# Patient Record
Sex: Female | Born: 1935 | Race: White | Hispanic: No | Marital: Married | Smoking: Never smoker
Health system: Southern US, Community
[De-identification: ages and names within clinical notes are randomized; demographics above are authoritative.]

## PROBLEM LIST (undated history)

## (undated) DIAGNOSIS — I2510 Atherosclerotic heart disease of native coronary artery without angina pectoris: Secondary | ICD-10-CM

## (undated) DIAGNOSIS — N189 Chronic kidney disease, unspecified: Secondary | ICD-10-CM

## (undated) DIAGNOSIS — M810 Age-related osteoporosis without current pathological fracture: Secondary | ICD-10-CM

## (undated) DIAGNOSIS — E119 Type 2 diabetes mellitus without complications: Secondary | ICD-10-CM

## (undated) DIAGNOSIS — R319 Hematuria, unspecified: Secondary | ICD-10-CM

## (undated) DIAGNOSIS — G252 Other specified forms of tremor: Secondary | ICD-10-CM

## (undated) DIAGNOSIS — I10 Essential (primary) hypertension: Secondary | ICD-10-CM

## (undated) DIAGNOSIS — E785 Hyperlipidemia, unspecified: Secondary | ICD-10-CM

## (undated) DIAGNOSIS — R4181 Age-related cognitive decline: Secondary | ICD-10-CM

## (undated) DIAGNOSIS — R809 Proteinuria, unspecified: Secondary | ICD-10-CM

## (undated) DIAGNOSIS — K5900 Constipation, unspecified: Secondary | ICD-10-CM

## (undated) DIAGNOSIS — F0390 Unspecified dementia without behavioral disturbance: Secondary | ICD-10-CM

## (undated) HISTORY — DX: Atherosclerotic heart disease of native coronary artery without angina pectoris: I25.10

## (undated) HISTORY — DX: Essential (primary) hypertension: I10

## (undated) HISTORY — DX: Hematuria, unspecified: R31.9

## (undated) HISTORY — PX: APPENDECTOMY: SHX54

## (undated) HISTORY — DX: Type 2 diabetes mellitus without complications: E11.9

## (undated) HISTORY — DX: Hyperlipidemia, unspecified: E78.5

## (undated) HISTORY — DX: Age-related osteoporosis without current pathological fracture: M81.0

## (undated) HISTORY — DX: Other specified forms of tremor: G25.2

## (undated) HISTORY — DX: Age-related cognitive decline: R41.81

## (undated) HISTORY — DX: Chronic kidney disease, unspecified: N18.9

## (undated) HISTORY — DX: Constipation, unspecified: K59.00

## (undated) HISTORY — DX: Proteinuria, unspecified: R80.9

---

## 2003-09-29 HISTORY — PX: OTHER SURGICAL HISTORY: SHX169

## 2007-11-23 ENCOUNTER — Ambulatory Visit: Payer: Self-pay | Admitting: Unknown Physician Specialty

## 2007-12-21 ENCOUNTER — Ambulatory Visit: Payer: Self-pay | Admitting: General Surgery

## 2008-02-08 ENCOUNTER — Other Ambulatory Visit: Payer: Self-pay

## 2008-02-08 ENCOUNTER — Emergency Department: Payer: Self-pay | Admitting: Emergency Medicine

## 2008-06-18 ENCOUNTER — Ambulatory Visit: Payer: Self-pay | Admitting: Neurology

## 2008-12-21 ENCOUNTER — Emergency Department: Payer: Self-pay | Admitting: Emergency Medicine

## 2009-02-21 ENCOUNTER — Emergency Department: Payer: Self-pay | Admitting: Internal Medicine

## 2009-07-01 ENCOUNTER — Emergency Department: Payer: Self-pay | Admitting: Emergency Medicine

## 2010-05-27 ENCOUNTER — Ambulatory Visit: Payer: Self-pay | Admitting: Family Medicine

## 2010-05-27 LAB — HM MAMMOGRAPHY

## 2011-05-04 ENCOUNTER — Ambulatory Visit: Payer: Self-pay | Admitting: Ophthalmology

## 2011-05-11 ENCOUNTER — Ambulatory Visit: Payer: Self-pay | Admitting: Ophthalmology

## 2011-05-12 ENCOUNTER — Emergency Department: Payer: Self-pay | Admitting: Emergency Medicine

## 2011-06-04 ENCOUNTER — Inpatient Hospital Stay: Payer: Self-pay | Admitting: Orthopedic Surgery

## 2011-06-11 ENCOUNTER — Emergency Department: Payer: Self-pay | Admitting: Emergency Medicine

## 2011-06-12 ENCOUNTER — Encounter: Payer: Self-pay | Admitting: Internal Medicine

## 2011-06-29 ENCOUNTER — Emergency Department: Payer: Self-pay | Admitting: Emergency Medicine

## 2011-06-29 ENCOUNTER — Encounter: Payer: Self-pay | Admitting: Internal Medicine

## 2011-06-30 ENCOUNTER — Ambulatory Visit: Payer: Self-pay | Admitting: Internal Medicine

## 2011-07-03 ENCOUNTER — Ambulatory Visit: Payer: Self-pay | Admitting: Internal Medicine

## 2011-07-06 ENCOUNTER — Telehealth: Payer: Self-pay | Admitting: Gastroenterology

## 2011-07-06 DIAGNOSIS — R1900 Intra-abdominal and pelvic swelling, mass and lump, unspecified site: Secondary | ICD-10-CM

## 2011-07-06 NOTE — Telephone Encounter (Signed)
Pt scheduled  

## 2011-07-06 NOTE — Telephone Encounter (Signed)
I spoke with the Pt husband and he advised me that she is at [facility] in [place] I will call and fax over the instructions and review meds.  [place] spoken to [place] took the instructions and meds reviewed and faxed to the home.[phone]

## 2011-07-09 ENCOUNTER — Ambulatory Visit: Payer: Self-pay

## 2011-07-09 ENCOUNTER — Encounter: Payer: Self-pay | Admitting: Gastroenterology

## 2011-07-16 LAB — PATHOLOGY REPORT

## 2011-07-21 ENCOUNTER — Telehealth: Payer: Self-pay | Admitting: Gastroenterology

## 2011-07-21 NOTE — Telephone Encounter (Signed)
I LVM today to discuss results.  Also I called referring MD, Dr. Haze Rushing.  FNA shows neuroendocrine lesion (final was read 10/18).  Dr. Haze Rushing was already aware of path, has spoken with patient and is scheduling further workup, testing.

## 2011-07-23 ENCOUNTER — Encounter: Payer: Self-pay | Admitting: Gastroenterology

## 2011-07-30 ENCOUNTER — Ambulatory Visit: Payer: Self-pay | Admitting: Internal Medicine

## 2011-07-30 ENCOUNTER — Encounter: Payer: Self-pay | Admitting: Internal Medicine

## 2011-08-29 ENCOUNTER — Encounter: Payer: Self-pay | Admitting: Internal Medicine

## 2011-08-29 ENCOUNTER — Ambulatory Visit: Payer: Self-pay | Admitting: Internal Medicine

## 2011-09-29 ENCOUNTER — Encounter: Payer: Self-pay | Admitting: Internal Medicine

## 2011-10-30 ENCOUNTER — Encounter: Payer: Self-pay | Admitting: Internal Medicine

## 2012-09-29 ENCOUNTER — Ambulatory Visit: Payer: Self-pay | Admitting: Ophthalmology

## 2012-10-03 ENCOUNTER — Ambulatory Visit: Payer: Self-pay | Admitting: Ophthalmology

## 2013-03-16 IMAGING — CR DG C-ARM 1-60 MIN
2 series · 2 of 2 positions shown · non-contrast
Comparison: none

REASON FOR EXAM: TFN LONG
COMMENTS:

PROCEDURE:     DXR - DXR C-ARM WITH 2 VIEWS LT HIP  - June 04, 2011  [DATE]
RESULT:     Comparison: AP pelvis 06/04/2011

[[id] (1 of 2)]
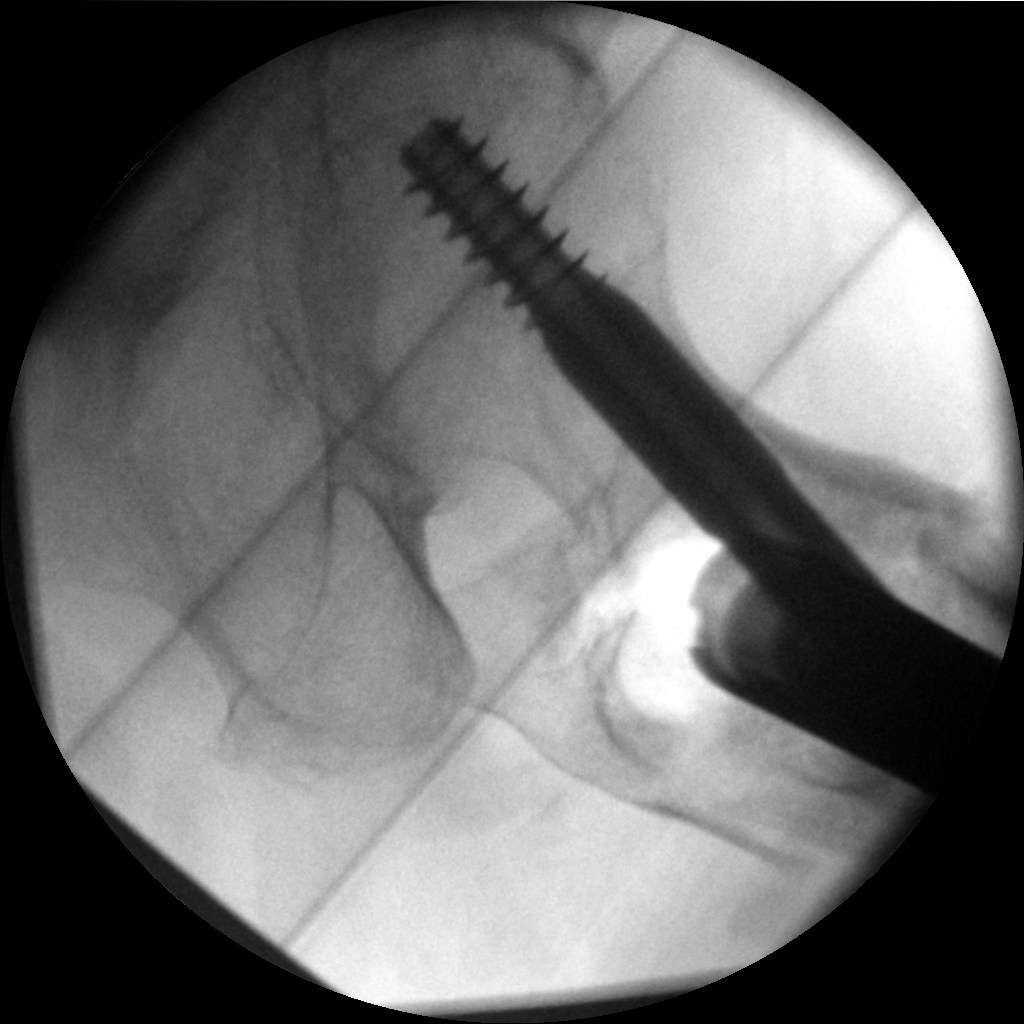

[[id] (2 of 2)]
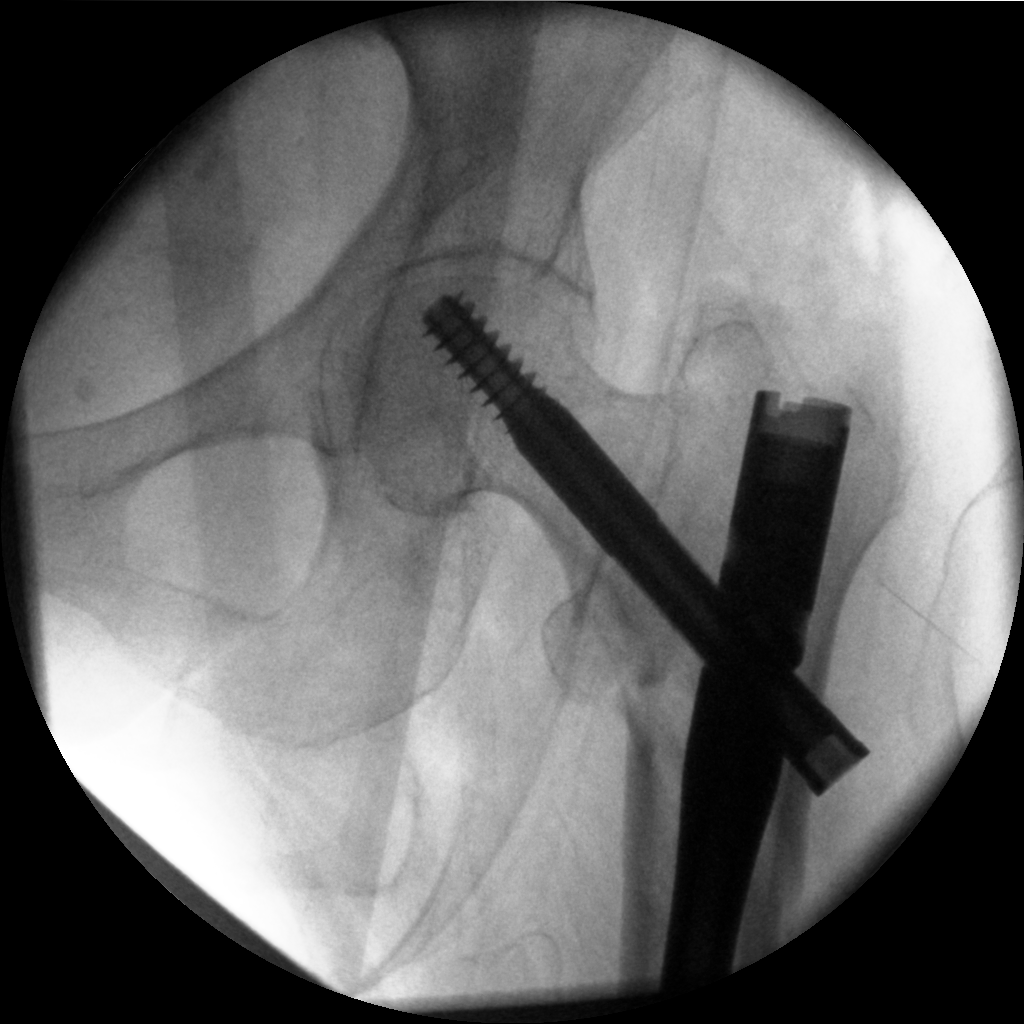

[2 of 2 positions shown; findings below may reference images not displayed]

FINDINGS: Two fluoroscopic images were submitted from intraoperative procedure.
Intramedullary rod and interlocking screw is seen in the proximal femur. The
mid and distal aspect of the intramedullary rod are not visualized. There is
improved alignment of the intertrochanteric fracture. Remainder of the
interpretation is deferred to the performing clinician.
IMPRESSION: Please see above.

## 2013-03-30 ENCOUNTER — Ambulatory Visit: Payer: Self-pay | Admitting: Cardiology

## 2014-07-28 ENCOUNTER — Emergency Department: Payer: Self-pay | Admitting: Emergency Medicine

## 2014-08-17 ENCOUNTER — Ambulatory Visit: Payer: Self-pay

## 2014-08-20 ENCOUNTER — Ambulatory Visit: Payer: Self-pay

## 2014-08-22 ENCOUNTER — Ambulatory Visit: Payer: Self-pay

## 2014-08-22 LAB — CBC CANCER CENTER
BASOS ABS: 0.2 x10 3/mm — AB (ref 0.0–0.1)
BASOS PCT: 1.4 %
EOS ABS: 0.1 x10 3/mm (ref 0.0–0.7)
EOS PCT: 0.9 %
HCT: 34.6 % — ABNORMAL LOW (ref 35.0–47.0)
HGB: 11.2 g/dL — ABNORMAL LOW (ref 12.0–16.0)
LYMPHS ABS: 2.9 x10 3/mm (ref 1.0–3.6)
Lymphocyte %: 27 %
MCH: 32 pg (ref 26.0–34.0)
MCHC: 32.3 g/dL (ref 32.0–36.0)
MCV: 99 fL (ref 80–100)
MONO ABS: 0.7 x10 3/mm (ref 0.2–0.9)
MONOS PCT: 6.6 %
NEUTROS PCT: 64.1 %
Neutrophil #: 6.8 x10 3/mm — ABNORMAL HIGH (ref 1.4–6.5)
Platelet: 267 x10 3/mm (ref 150–440)
RBC: 3.49 10*6/uL — AB (ref 3.80–5.20)
RDW: 18.6 % — AB (ref 11.5–14.5)
WBC: 10.7 x10 3/mm (ref 3.6–11.0)

## 2014-08-22 LAB — COMPREHENSIVE METABOLIC PANEL
ALT: 16 U/L
AST: 11 U/L — AB (ref 15–37)
Albumin: 4.1 g/dL (ref 3.4–5.0)
Alkaline Phosphatase: 92 U/L
Anion Gap: 6 — ABNORMAL LOW (ref 7–16)
BUN: 11 mg/dL (ref 7–18)
Bilirubin,Total: 0.7 mg/dL (ref 0.2–1.0)
CALCIUM: 9.6 mg/dL (ref 8.5–10.1)
CREATININE: 0.91 mg/dL (ref 0.60–1.30)
Chloride: 98 mmol/L (ref 98–107)
Co2: 31 mmol/L (ref 21–32)
EGFR (African American): >60
Glucose: 156 mg/dL — ABNORMAL HIGH (ref 65–99)
OSMOLALITY: 273 (ref 275–301)
POTASSIUM: 3.9 mmol/L (ref 3.5–5.1)
Sodium: 135 mmol/L — ABNORMAL LOW (ref 136–145)
TOTAL PROTEIN: 7.9 g/dL (ref 6.4–8.2)

## 2014-08-22 LAB — APTT: Activated PTT: 28.3 secs (ref 23.6–35.9)

## 2014-08-22 LAB — PROTIME-INR
INR: 1
Prothrombin Time: 13.5 secs (ref 11.5–14.7)

## 2014-08-28 ENCOUNTER — Ambulatory Visit: Payer: Self-pay

## 2014-09-10 ENCOUNTER — Ambulatory Visit: Payer: Self-pay | Admitting: Obstetrics & Gynecology

## 2014-09-10 LAB — BASIC METABOLIC PANEL
Anion Gap: 5 — ABNORMAL LOW (ref 7–16)
BUN: 9 mg/dL (ref 7–18)
CO2: 30 mmol/L (ref 21–32)
CREATININE: 0.82 mg/dL (ref 0.60–1.30)
Calcium, Total: 9.4 mg/dL (ref 8.5–10.1)
Chloride: 100 mmol/L (ref 98–107)
EGFR (African American): >60
EGFR (Non-African Amer.): >60
GLUCOSE: 146 mg/dL — AB (ref 65–99)
Osmolality: 271 (ref 275–301)
Potassium: 4.3 mmol/L (ref 3.5–5.1)
Sodium: 135 mmol/L — ABNORMAL LOW (ref 136–145)

## 2014-09-10 LAB — APTT: Activated PTT: 27.4 secs (ref 23.6–35.9)

## 2014-09-10 LAB — CBC
HCT: 33.5 % — ABNORMAL LOW (ref 35.0–47.0)
HGB: 10.8 g/dL — AB (ref 12.0–16.0)
MCH: 31.9 pg (ref 26.0–34.0)
MCHC: 32.1 g/dL (ref 32.0–36.0)
MCV: 99 fL (ref 80–100)
PLATELETS: 273 10*3/uL (ref 150–440)
RBC: 3.37 10*6/uL — AB (ref 3.80–5.20)
RDW: 19.3 % — ABNORMAL HIGH (ref 11.5–14.5)
WBC: 10 10*3/uL (ref 3.6–11.0)

## 2014-09-10 LAB — PROTIME-INR
INR: 1.1
Prothrombin Time: 13.7 secs (ref 11.5–14.7)

## 2014-09-12 ENCOUNTER — Ambulatory Visit: Payer: Self-pay | Admitting: Obstetrics & Gynecology

## 2014-09-12 HISTORY — PX: ABDOMINAL HYSTERECTOMY: SHX81

## 2014-09-12 LAB — CREATININE, SERUM
Creatinine: 0.83 mg/dL (ref 0.60–1.30)
EGFR (African American): >60
EGFR (Non-African Amer.): >60

## 2014-09-13 LAB — CBC WITH DIFFERENTIAL/PLATELET
Basophil #: 0.1 10*3/uL (ref 0.0–0.1)
Basophil %: 0.4 %
EOS PCT: 0 %
Eosinophil #: 0 10*3/uL (ref 0.0–0.7)
HCT: 32.1 % — ABNORMAL LOW (ref 35.0–47.0)
HGB: 10.3 g/dL — ABNORMAL LOW (ref 12.0–16.0)
Lymphocyte #: 1.2 10*3/uL (ref 1.0–3.6)
Lymphocyte %: 6.4 %
MCH: 31.9 pg (ref 26.0–34.0)
MCHC: 32.1 g/dL (ref 32.0–36.0)
MCV: 99 fL (ref 80–100)
MONOS PCT: 5 %
Monocyte #: 0.9 x10 3/mm (ref 0.2–0.9)
Neutrophil #: 15.9 10*3/uL — ABNORMAL HIGH (ref 1.4–6.5)
Neutrophil %: 88.2 %
PLATELETS: 245 10*3/uL (ref 150–440)
RBC: 3.23 10*6/uL — ABNORMAL LOW (ref 3.80–5.20)
RDW: 18.7 % — AB (ref 11.5–14.5)
WBC: 18.1 10*3/uL — ABNORMAL HIGH (ref 3.6–11.0)

## 2014-09-13 LAB — BASIC METABOLIC PANEL
ANION GAP: 9 (ref 7–16)
Anion Gap: 8 (ref 7–16)
BUN: 12 mg/dL (ref 7–18)
BUN: 13 mg/dL (ref 7–18)
CHLORIDE: 103 mmol/L (ref 98–107)
CREATININE: 0.92 mg/dL (ref 0.60–1.30)
Calcium, Total: 8.5 mg/dL (ref 8.5–10.1)
Calcium, Total: 8.5 mg/dL (ref 8.5–10.1)
Chloride: 101 mmol/L (ref 98–107)
Co2: 24 mmol/L (ref 21–32)
Co2: 24 mmol/L (ref 21–32)
Creatinine: 0.9 mg/dL (ref 0.60–1.30)
EGFR (African American): >60
EGFR (African American): >60
GLUCOSE: 212 mg/dL — AB (ref 65–99)
Glucose: 176 mg/dL — ABNORMAL HIGH (ref 65–99)
OSMOLALITY: 273 (ref 275–301)
OSMOLALITY: 276 (ref 275–301)
POTASSIUM: 4.1 mmol/L (ref 3.5–5.1)
Potassium: 3.9 mmol/L (ref 3.5–5.1)
SODIUM: 135 mmol/L — AB (ref 136–145)
Sodium: 134 mmol/L — ABNORMAL LOW (ref 136–145)

## 2014-09-13 LAB — MAGNESIUM
Magnesium: 1.4 mg/dL — ABNORMAL LOW
Magnesium: 2.4 mg/dL

## 2014-09-28 ENCOUNTER — Ambulatory Visit: Payer: Self-pay

## 2014-09-28 ENCOUNTER — Ambulatory Visit: Payer: Self-pay | Admitting: Obstetrics and Gynecology

## 2014-10-29 ENCOUNTER — Ambulatory Visit: Payer: Self-pay | Admitting: Obstetrics and Gynecology

## 2015-01-18 NOTE — Op Note (Signed)
PATIENT NAME:  Doris Evans, Doris Evans MR#:  [id number] DATE OF BIRTH:  January 24, 1929  DATE OF PROCEDURE:  10/03/2012  PREOPERATIVE DIAGNOSIS: Cataract, right eye.   POSTOPERATIVE DIAGNOSIS: Cataract, right eye.   PROCEDURE PERFORMED: Extracapsular cataract extraction using phacoemulsification with placement of Alcon [id number], 23.5 diopter posterior chamber lens, serial number [id number].   SURGEON: Maylon PeppersSteven A. Chamia Schmutz, M.D.   ANESTHESIA: 4% lidocaine and 0.75% Marcaine a 50-50 mixture with 10 units/mL of Hylenex added, given as a peribulbar.   ANESTHESIOLOGIST: Dr. Pernell DupreAdams.   COMPLICATIONS: None.   ESTIMATED BLOOD LOSS: Less than 1 mL.   DESCRIPTION OF PROCEDURE: The patient was brought to the operating room and given a peribulbar block. She was prepped and draped in the usual fashion. The vertical rectus muscles were imbricated using 5-0 silk sutures, bridle sutures. A limbal peritomy was carried out at 12 o'clock for one clock hour and hemostasis was obtained with cautery. A partial thickness scleral groove was made at the posterior surgical limbus and dissected anteriorly into clear cornea with an Alcon crescent knife. The anterior chamber was entered superonasally through clear cornea with a paracentesis knife. Air was used to replace the aqueous and vision blue was inserted into the anterior chamber to paint the anterior capsule. DisCoVisc was then used to displace the air and the anterior chamber was entered through the lamellar dissection with a 2.6 mm keratome.  A continuous tear circular capsulorrhexis was carried out without difficulty. Hydrodissection was used to loosen the nucleus and phacoemulsification was carried out in a divide and conquer technique. Total ultrasound time was 8.13 seconds with an average power of 29.9% and a CDE of 198.4. The lens was noted to be 4+ hard. Irrigation-aspiration was used to remove the residual cortex. The capsular bag was inflated with DisCoVisc and the intraocular  lens was inserted in the capsular bag under direct visualization. Irrigation-aspiration was used to remove the residual DisCoVisc. The wound was then inflated with balanced salt. Miostat was injected in the paracentesis track. A tenth of mL of cefuroxime was also injected through the paracentesis track. The wound was checked for leaks. None were found. The conjunctiva was closed with cautery. Bridle sutures were removed. Two drops of Vigamox were placed. A shield was placed on the eye. The patient was discharged to the recovery room in good condition.   ____________________________ Maylon PeppersSteven A. Lendell Gallick, MD sad:aw D: 10/03/2012 13:50:07 ET T: 10/04/2012 06:36:23 ET JOB#: [id number]  cc: Viviann SpareSteven A. Nivaan Dicenzo, MD, <Dictator> Erline LevineSTEVEN A Tate Jerkins MD ELECTRONICALLY SIGNED 10/10/2012 13:10

## 2015-01-19 NOTE — Op Note (Signed)
PATIENT NAME:  Doris RosserGE, Jazell F MR#:  [id number] DATE OF BIRTH:  12/02/1928  DATE OF PROCEDURE:  09/12/2014  PREOPERATIVE DIAGNOSIS:  Serous carcinoma of the uterus.   POSTOPERATIVE DIAGNOSIS:  Serous carcinoma of the uterus.  PROCEDURE: Total laparoscopic hysterectomy with bilateral salpingo-oophorectomy.   SURGEON: Dr. Dierdre Searles. Paul Mkenzie Dotts.   ASSISTANT: Leida LauthAndrew Berchuck, M.D.   ANESTHESIA: General.   ESTIMATED BLOOD LOSS: Minimal 10 ml.   COMPLICATIONS: None.   FINDINGS: A small sized uterus normal size ovaries, no evidence for extrauterine disease. Normal liver, gallbladder and bowel.   DISPOSITION: To the recovery room in stable condition.   TECHNIQUE: The patient is prepped and draped in the usual sterile fashion after adequate anesthesia is obtained in the dorsal lithotomy position. Bladder was drained with a Foley catheter that was left in place for the entirety of the case. Speculum is placed and the cervix is identified and then  grasped with a tenaculum. The uterus is sounded to 7 cm, a V-Care device is placed for manipulation of the uterus and excision of the cervix.   Attention is then turned to the abdomen where a Veress needle is inserted through a 5 mm infraumbilical incision after Marcaine is used to anesthetize the skin. Veress needle placement is confirmed using the hanging drop technique and the abdomen is then insufflated with CO2 gas. A 5 mm trocar is then inserted under direct visualization with the laparoscope with no injuries or bleeding noted. The patient is placed in Trendelenburg position. The above-mentioned findings are visualized.   An 11 mm trocar is placed in the right lower quadrant and a 5 mm trocar is placed in the left lower quadrant lateral to the inferior epigastric blood vessels with no injuries or bleeding noted. Washings are obtained with saline fluid injected on and around the uterus with aspiration of this fluid.   The right and left infundibulopelvic blood  vessels and ligaments are carefully dissected and observed to be away from the ureter. The pedicle was then coagulated with a bipolar cautery device and then a careful dissection using the Harmonic scalpel is performed. The round ligaments are carefully coagulated and cut with excellent hemostasis noted and then the broad ligament is dissected down to the level of the uterine arteries. The uterine arteries are carefully coagulated and cut. The V-Care device is seen tenting through the tissues at the cervicovaginal injunction. The bladder was dissected inferiorly, and away. A circumferential incision using the Harmonic scalpel is used to completely amputate the uterus cervix along with the adnexa on both sides and it is then removed vaginally. A balloon is left in the vagina to maintain pneumoperitoneum until the conclusion of suturing of the cuff.   Using the Endo Stitch device and a V-Lock suture the vaginal cuff is closed in a running fashion.  The vaginal exam reveals complete closure of the vaginal cuff without defect or leakage of air. Excellent hemostasis is also noted. Pelvic cavity is irrigated with aspiration of all fluid. Examination of all pedicles revealed excellent hemostasis. Observation of the ureters as well as bowel reveals no apparent injury or complication. The patient is leveled, gas is expelled. Trocars are removed, and skin is closed with Dermabond.  The vaginal balloon is removed and Foley catheter is left in place. The patient goes to the recovery room in stable condition, tolerating the procedure well.  All sponge, instrument, and needle counts are correct at the conclusion of the case.   ____________________________ R. Annamarie MajorPaul Linnea Todisco,  MD rph:at D: 09/12/2014 09:33:32 ET T: 09/12/2014 17:14:16 ET JOB#: [id number]  cc: Dierdre Searles, MD, <Dictator> Nadara Mustard MD ELECTRONICALLY SIGNED 09/13/2014 8:04

## 2015-01-19 NOTE — Consult Note (Signed)
PATIENT NAME:  Doris Evans, Doris Evans MR#:  [id number] DATE OF BIRTH:  Apr 18, 1929  DATE OF CONSULTATION:  09/12/2014  REFERRING PHYSICIAN:  Dierdre Searles, MD   CONSULTING PHYSICIAN:  Shaune Pollack, MD  PRIMARY CARE PHYSICIAN:  Steele Sizer, MD    REASON FOR CONSULT:  Slow breathing rate today.   HISTORY OF PRESENT ILLNESS:  The patient is an 81 years old Caucasian female with a history of hypertension, diabetes, was admitted for uterine cancer, status post laparoscopic hysterectomy. After surgery, the patient was noticed to have a slow breathing rate at about 10 per minute.  Dr. Tiburcio Pea has concerns, so he requested a medical consult from hospitalist. According to the nurse, the patient was under general anesthesia during operation. The patient was sleepy andsomnolent after transfer to the OB/GYN unit. The patient's breathing rate was about 7 to 10 but no trouble breathing.  O2 saturation was 100%.   When I see the patient the patient is awake, coughing some sputum. The patient denies any symptoms. She denies any shortness of breath or hematemesis. Denies any chest pain, palpitation, abdominal pain, nausea, vomiting, or diarrhea.   PAST MEDICAL HISTORY: Chickenpox, malignant hypertension, vertigo, diabetes 2, osteoporosis, thyroid nodule.   SURGICAL HISTORY: Appendectomy.   SOCIAL HISTORY: No smoking or drinking or illicit drugs.   FAMILY HISTORY: CAD, breast cancer, tremors. The patient's father died at the age of 2 of MI.  Mother died at the age of 20.   REVIEW OF SYSTEMS:  CONSTITUTIONAL: The patient denies any fever, chills. No headache or dizziness.  EYES: No double vision or blurred vision.  EARS, NOSE, THROAT: No postnasal drip, slurred speech or dysphagia.  CARDIOVASCULAR: No chest pain, palpitation, orthopnea, nocturnal dyspnea. No leg edema.  PULMONARY: Positive for cough, sputum but no shortness of breath or hematemesis. GASTROINTESTINAL: No abdominal pain, nausea, vomiting, diarrhea. No  melena or bloody stool.  GENITOURINARY: No dysuria, hematuria, or incontinence.  SKIN: No rash or jaundice.  NEUROLOGY: No syncope, loss of consciousness, or seizure.  ENDOCRINOLOGY: No polyuria or polydipsia, heat or cold intolerance.  HEMATOLOGY: No easy bruising or bleeding.   ALLERGIES: DIOVAN. KEFLEX.   HOME MEDICATIONS:  1.  Trazodone 50 mg p.o. at bedtime p.r.n. for sleep.  2.  Levemir 21 units subcutaneous once a day at bedtime.  3.  Colace 100 mg p.o. once a day p.r.n. for constipation.  4.  Atenolol 25 mg p.o. b.i.d.  5.  Aspirin 81 mg p.o. daily.   PHYSICAL EXAMINATION:  VITAL SIGNS:  Temperature 97.3, pulse 83, respiration rate 7, blood pressure 162/69, oxygen saturation 100% on oxygen 2 liters.  GENERAL: The patient is alert, awake, oriented x 3 but a little sleepy, in no acute distress.  HEENT: Pupils round, equal, react to light and accommodation. Moist oral mucosa.  NECK: Supple. No JVD or carotid bruit. No lymphadenopathy. No thyromegaly.  CARDIOVASCULAR: S1 and S2. Regular rate, rhythm. No murmurs or gallops.  PULMONARY: Bilateral air entry. No wheezing or rales. No use of accessory muscle to breathe.  ABDOMEN: Soft and no distention or tenderness. No organomegaly. Bowel sounds present.  EXTREMITIES: No edema, clubbing or cyanosis. No calf tenderness. Bilateral pedal pulses present.  SKIN: No rash or jaundice.  NEUROLOGIC: A and O x 3 but maybe a little bit demented, followed commands, no focal deficit. Power 4/5. Sensation intact.   LABORATORY DATA: WBC 10, hemoglobin 10.8, platelets 273, glucose 146, BUN 9, creatinine 0.82. Electrolytes are normal. This lab is 2  days ago.   Just now got a chest x-ray due to patient's low breathing rate.  Chest x-ray shows patchy opacity in the right lower lung suspicious of pneumonia, increased interstitial markings with some mild interstitial edema, possible left pleural effusion.   IMPRESSION:  1.  Slow breathing rate, which is  a possibly from anesthesia.  2.  Accelerated hypertension. The patient's blood pressure just now was 195/93.  3.  Diabetes.  4.  Anemia.  5. Possible pneumonia due to aspiration.  RECOMMENDATION:   1.  For slowing breathing rate, which is possibly due to anesthesia, I advised the patient and nurse monitor breathing rate and O2 saturation.  2.  For possible pneumonia from chest x-ray, I will start levaquin due to the risk of aspiration from anesthesia.  In addition, we will give DuoNeb p.r.n. and cough medication.  3.  For accelerated hypertension, I will continue atenolol and give hydralazine IV p.r.n.  4.  For diabetes, continue sliding scale.   I discussed the patient's condition and recommendations with the patient, the patient's husband and the son and the nurse. The patient seems not understanding about the code status. According to the patient's husband and son, the patient is a full code.   TIME SPENT: About 57 minutes.   ____________________________ Shaune PollackQing Hagen Tidd, MD qc:AT D: 09/12/2014 18:30:46 ET T: 09/12/2014 23:00:41 ET JOB#: [id number]  cc: Shaune PollackQing Brittanee Ghazarian, MD, <Dictator> Shaune PollackQING Corra Kaine MD ELECTRONICALLY SIGNED 09/13/2014 16:34

## 2015-01-21 LAB — SURGICAL PATHOLOGY

## 2015-08-21 ENCOUNTER — Ambulatory Visit (INDEPENDENT_AMBULATORY_CARE_PROVIDER_SITE_OTHER): Payer: Commercial Managed Care - HMO | Admitting: Unknown Physician Specialty

## 2015-08-21 ENCOUNTER — Encounter: Payer: Self-pay | Admitting: Unknown Physician Specialty

## 2015-08-21 VITALS — BP 150/71 | HR 91 | Temp 97.6°F | Ht <= 58 in | Wt 105.2 lb

## 2015-08-21 DIAGNOSIS — E1122 Type 2 diabetes mellitus with diabetic chronic kidney disease: Secondary | ICD-10-CM | POA: Diagnosis not present

## 2015-08-21 DIAGNOSIS — Z23 Encounter for immunization: Secondary | ICD-10-CM

## 2015-08-21 DIAGNOSIS — N182 Chronic kidney disease, stage 2 (mild): Secondary | ICD-10-CM | POA: Diagnosis not present

## 2015-08-21 DIAGNOSIS — M810 Age-related osteoporosis without current pathological fracture: Secondary | ICD-10-CM

## 2015-08-21 DIAGNOSIS — I1310 Hypertensive heart and chronic kidney disease without heart failure, with stage 1 through stage 4 chronic kidney disease, or unspecified chronic kidney disease: Secondary | ICD-10-CM

## 2015-08-21 DIAGNOSIS — I129 Hypertensive chronic kidney disease with stage 1 through stage 4 chronic kidney disease, or unspecified chronic kidney disease: Secondary | ICD-10-CM

## 2015-08-21 DIAGNOSIS — H6123 Impacted cerumen, bilateral: Secondary | ICD-10-CM

## 2015-08-21 DIAGNOSIS — E785 Hyperlipidemia, unspecified: Secondary | ICD-10-CM | POA: Insufficient documentation

## 2015-08-21 DIAGNOSIS — I25119 Atherosclerotic heart disease of native coronary artery with unspecified angina pectoris: Secondary | ICD-10-CM

## 2015-08-21 DIAGNOSIS — N189 Chronic kidney disease, unspecified: Secondary | ICD-10-CM | POA: Diagnosis not present

## 2015-08-21 DIAGNOSIS — E119 Type 2 diabetes mellitus without complications: Secondary | ICD-10-CM | POA: Insufficient documentation

## 2015-08-21 DIAGNOSIS — G252 Other specified forms of tremor: Secondary | ICD-10-CM | POA: Insufficient documentation

## 2015-08-21 DIAGNOSIS — K5900 Constipation, unspecified: Secondary | ICD-10-CM | POA: Insufficient documentation

## 2015-08-21 DIAGNOSIS — R4181 Age-related cognitive decline: Secondary | ICD-10-CM | POA: Insufficient documentation

## 2015-08-21 DIAGNOSIS — R319 Hematuria, unspecified: Secondary | ICD-10-CM | POA: Insufficient documentation

## 2015-08-21 DIAGNOSIS — I2510 Atherosclerotic heart disease of native coronary artery without angina pectoris: Secondary | ICD-10-CM | POA: Insufficient documentation

## 2015-08-21 DIAGNOSIS — R809 Proteinuria, unspecified: Secondary | ICD-10-CM | POA: Insufficient documentation

## 2015-08-21 LAB — LIPID PANEL PICCOLO, WAIVED
Chol/HDL Ratio Piccolo,Waive: 2.1 mg/dL
Cholesterol Piccolo, Waived: 128 mg/dL (ref <200)
HDL Chol Piccolo, Waived: 61 mg/dL (ref >59)
LDL Chol Calc Piccolo Waived: 54 mg/dL (ref <100)
Triglycerides Piccolo,Waived: 67 mg/dL (ref <150)
VLDL CHOL CALC PICCOLO,WAIVE: 13 mg/dL (ref <30)

## 2015-08-21 LAB — MICROALBUMIN, URINE WAIVED
Creatinine, Urine Waived: 100 mg/dL (ref 10–300)
MICROALB, UR WAIVED: 80 mg/L — AB (ref 0–19)

## 2015-08-21 LAB — BAYER DCA HB A1C WAIVED: HB A1C (BAYER DCA - WAIVED): 6.7 % (ref <7.0)

## 2015-08-21 MED ORDER — LOSARTAN POTASSIUM 50 MG PO TABS: 50.0000 mg | ORAL_TABLET | Freq: Every day | ORAL | Status: AC

## 2015-08-21 NOTE — Assessment & Plan Note (Signed)
LDL is 54.  Continue off medication

## 2015-08-21 NOTE — Progress Notes (Signed)
BP 150/71 mmHg  Pulse 91  Temp(Src) 97.6 F (36.4 C)  Ht 4' 9.1" (1.45 m)  Wt 105 lb 3.2 oz (47.718 kg)  BMI 22.70 kg/m2  SpO2 96%  LMP  (LMP Unknown)   Subjective:    Patient ID: Doris Evans, female    DOB: April 03, 1929, 81 y.o.   MRN: [id number]  HPI: Doris Evans is a 81 y.o. female  Chief Complaint  Patient presents with  . Diabetes  . Hyperlipidemia  . Hypertension   Diabetes:  Using medications without difficulties No hypoglycemic episodes: no No hyperglycemic episodes:no Feet problems:none Takes 21 units of Levimir at night Blood Sugars averaging: not checking eye exam within last year  Hypertension:  Not taking any BP meds Average home BP not checking  No chest pain with exertion or shortness of breath No Edema  Elevated Cholesterol: Not taking statins Diet compliance: losing weight Exercise: none  Relevant past medical, surgical, family and social history reviewed and updated as indicated. Interim medical history since our last visit reviewed. Allergies and medications reviewed and updated.  Review of Systems  HENT:       Trouble hearing.  Wants ears checked.     Per HPI unless specifically indicated above     Objective:    BP 150/71 mmHg  Pulse 91  Temp(Src) 97.6 F (36.4 C)  Ht 4' 9.1" (1.45 m)  Wt 105 lb 3.2 oz (47.718 kg)  BMI 22.70 kg/m2  SpO2 96%  LMP  (LMP Unknown)  Wt Readings from Last 3 Encounters:  08/21/15 105 lb 3.2 oz (47.718 kg)  09/04/14 110 lb (49.896 kg)    Physical Exam  Constitutional: She is oriented to person, place, and time. She appears well-developed and well-nourished. No distress.  HENT:  Head: Normocephalic and atraumatic.  Eyes: Conjunctivae and lids are normal. Right eye exhibits no discharge. Left eye exhibits no discharge. No scleral icterus.  Cardiovascular: Normal rate, regular rhythm and normal heart sounds.   Pulmonary/Chest: Effort normal and breath sounds normal. No respiratory distress.   Abdominal: Normal appearance. There is no splenomegaly or hepatomegaly.  Musculoskeletal: Normal range of motion.  Neurological: She is alert and oriented to person, place, and time.  Skin: Skin is intact. No rash noted. No pallor.  Psychiatric: She has a normal mood and affect. Her behavior is normal. Judgment and thought content normal.    Results for orders placed or performed in visit on 08/21/15  HM MAMMOGRAPHY  Result Value Ref Range   HM Mammogram from PP       Assessment & Plan:   Problem List Items Addressed This Visit      Unprioritized   Hyperlipemia    LDL is 54.  Continue off medication      Relevant Medications   losartan (COZAAR) 50 MG tablet   Other Relevant Orders   Lipid Panel Piccolo, Waived   Benign hypertension with chronic kidney disease    Restart Losartan do to Microalbumin of 89 and BP poorly controlled      Relevant Medications   losartan (COZAAR) 50 MG tablet   Diabetes (HCC)    Hgb A1C is 6.7.  Continue just 20 units at night.        Relevant Medications   losartan (COZAAR) 50 MG tablet   Other Relevant Orders   Bayer DCA Hb A1c Waived    Other Visit Diagnoses    Immunization due    -  Primary  Relevant Orders    Flu Vaccine QUAD 36+ mos IM (Completed)    Cerumen impaction, bilateral        Pt ed on using baby oil in ears and will recheck for removal        Follow up plan: Return for ceruman removal.

## 2015-08-21 NOTE — Assessment & Plan Note (Signed)
Hgb A1C is 6.7.  Continue just 20 units at night.

## 2015-08-21 NOTE — Assessment & Plan Note (Signed)
Restart Losartan do to Microalbumin of 89 and BP poorly controlled

## 2015-08-21 NOTE — Patient Instructions (Addendum)
May use mineral oil or baby oil in ear.  Put in a few drops daily.  This will make cleaning out the wax build-up easier at a future visit.    Continue insulin Add Losartan Take an 81 g ASA/day

## 2015-08-22 LAB — COMPREHENSIVE METABOLIC PANEL
A/G RATIO: 1.5 (ref 1.1–2.5)
ALT: 6 IU/L (ref 0–32)
AST: 13 IU/L (ref 0–40)
Albumin: 4.2 g/dL (ref 3.5–4.7)
Alkaline Phosphatase: 70 IU/L (ref 39–117)
BILIRUBIN TOTAL: 0.8 mg/dL (ref 0.0–1.2)
BUN/Creatinine Ratio: 14 (ref 11–26)
BUN: 10 mg/dL (ref 8–27)
CALCIUM: 9.5 mg/dL (ref 8.7–10.3)
CO2: 23 mmol/L (ref 18–29)
Chloride: 97 mmol/L (ref 97–106)
Creatinine, Ser: 0.73 mg/dL (ref 0.57–1.00)
GFR, EST AFRICAN AMERICAN: 87 mL/min/{1.73_m2} (ref >59)
GFR, EST NON AFRICAN AMERICAN: 76 mL/min/{1.73_m2} (ref >59)
GLOBULIN, TOTAL: 2.8 g/dL (ref 1.5–4.5)
Glucose: 127 mg/dL — ABNORMAL HIGH (ref 65–99)
POTASSIUM: 4.5 mmol/L (ref 3.5–5.2)
SODIUM: 137 mmol/L (ref 136–144)
TOTAL PROTEIN: 7 g/dL (ref 6.0–8.5)

## 2015-08-22 LAB — URIC ACID: Uric Acid: 5.9 mg/dL (ref 2.5–7.1)

## 2015-09-11 ENCOUNTER — Ambulatory Visit: Payer: Self-pay | Admitting: Unknown Physician Specialty

## 2015-10-11 ENCOUNTER — Other Ambulatory Visit: Payer: Self-pay | Admitting: Family Medicine

## 2015-10-11 NOTE — Telephone Encounter (Signed)
Looks like she is yours   

## 2015-10-22 ENCOUNTER — Inpatient Hospital Stay
Admission: EM | Admit: 2015-10-22 | Discharge: 2015-10-27 | DRG: 637 | Disposition: A | Discharge status: Home / Self Care | Payer: Medicare HMO | Attending: Internal Medicine | Admitting: Internal Medicine

## 2015-10-22 ENCOUNTER — Emergency Department: Payer: Medicare HMO

## 2015-10-22 ENCOUNTER — Encounter: Payer: Self-pay | Admitting: Emergency Medicine

## 2015-10-22 DIAGNOSIS — E1122 Type 2 diabetes mellitus with diabetic chronic kidney disease: Secondary | ICD-10-CM | POA: Diagnosis not present

## 2015-10-22 DIAGNOSIS — Z23 Encounter for immunization: Secondary | ICD-10-CM | POA: Diagnosis not present

## 2015-10-22 DIAGNOSIS — E1121 Type 2 diabetes mellitus with diabetic nephropathy: Secondary | ICD-10-CM | POA: Diagnosis present

## 2015-10-22 DIAGNOSIS — I2510 Atherosclerotic heart disease of native coronary artery without angina pectoris: Secondary | ICD-10-CM | POA: Diagnosis present

## 2015-10-22 DIAGNOSIS — N184 Chronic kidney disease, stage 4 (severe): Secondary | ICD-10-CM | POA: Diagnosis present

## 2015-10-22 DIAGNOSIS — Z8249 Family history of ischemic heart disease and other diseases of the circulatory system: Secondary | ICD-10-CM | POA: Diagnosis not present

## 2015-10-22 DIAGNOSIS — E871 Hypo-osmolality and hyponatremia: Secondary | ICD-10-CM | POA: Diagnosis present

## 2015-10-22 DIAGNOSIS — H9190 Unspecified hearing loss, unspecified ear: Secondary | ICD-10-CM | POA: Diagnosis present

## 2015-10-22 DIAGNOSIS — M810 Age-related osteoporosis without current pathological fracture: Secondary | ICD-10-CM | POA: Diagnosis not present

## 2015-10-22 DIAGNOSIS — N183 Chronic kidney disease, stage 3 (moderate): Secondary | ICD-10-CM | POA: Diagnosis not present

## 2015-10-22 DIAGNOSIS — I5023 Acute on chronic systolic (congestive) heart failure: Secondary | ICD-10-CM | POA: Diagnosis not present

## 2015-10-22 DIAGNOSIS — E860 Dehydration: Secondary | ICD-10-CM | POA: Diagnosis not present

## 2015-10-22 DIAGNOSIS — E162 Hypoglycemia, unspecified: Secondary | ICD-10-CM

## 2015-10-22 DIAGNOSIS — Z7982 Long term (current) use of aspirin: Secondary | ICD-10-CM

## 2015-10-22 DIAGNOSIS — Z794 Long term (current) use of insulin: Secondary | ICD-10-CM | POA: Diagnosis not present

## 2015-10-22 DIAGNOSIS — I130 Hypertensive heart and chronic kidney disease with heart failure and stage 1 through stage 4 chronic kidney disease, or unspecified chronic kidney disease: Secondary | ICD-10-CM | POA: Diagnosis not present

## 2015-10-22 DIAGNOSIS — F0390 Unspecified dementia without behavioral disturbance: Secondary | ICD-10-CM | POA: Diagnosis present

## 2015-10-22 DIAGNOSIS — E11649 Type 2 diabetes mellitus with hypoglycemia without coma: Secondary | ICD-10-CM | POA: Diagnosis present

## 2015-10-22 DIAGNOSIS — G9340 Encephalopathy, unspecified: Secondary | ICD-10-CM | POA: Diagnosis present

## 2015-10-22 DIAGNOSIS — N390 Urinary tract infection, site not specified: Secondary | ICD-10-CM

## 2015-10-22 DIAGNOSIS — R7989 Other specified abnormal findings of blood chemistry: Secondary | ICD-10-CM

## 2015-10-22 DIAGNOSIS — E785 Hyperlipidemia, unspecified: Secondary | ICD-10-CM | POA: Diagnosis present

## 2015-10-22 DIAGNOSIS — R062 Wheezing: Secondary | ICD-10-CM

## 2015-10-22 DIAGNOSIS — R778 Other specified abnormalities of plasma proteins: Secondary | ICD-10-CM

## 2015-10-22 LAB — CBC WITH DIFFERENTIAL/PLATELET
BASOS ABS: 0.1 10*3/uL (ref 0–0.1)
Basophils Relative: 1 %
EOS PCT: 0 %
Eosinophils Absolute: 0 10*3/uL (ref 0–0.7)
HCT: 36 % (ref 35.0–47.0)
Hemoglobin: 11.6 g/dL — ABNORMAL LOW (ref 12.0–16.0)
LYMPHS PCT: 10 %
Lymphs Abs: 0.9 10*3/uL — ABNORMAL LOW (ref 1.0–3.6)
MCH: 30.5 pg (ref 26.0–34.0)
MCHC: 32.3 g/dL (ref 32.0–36.0)
MCV: 94.6 fL (ref 80.0–100.0)
MONO ABS: 0.7 10*3/uL (ref 0.2–0.9)
MONOS PCT: 7 %
Neutro Abs: 8 10*3/uL — ABNORMAL HIGH (ref 1.4–6.5)
Neutrophils Relative %: 82 %
PLATELETS: 215 10*3/uL (ref 150–440)
RBC: 3.8 MIL/uL (ref 3.80–5.20)
RDW: 24.4 % — AB (ref 11.5–14.5)
WBC: 9.7 10*3/uL (ref 3.6–11.0)

## 2015-10-22 LAB — COMPREHENSIVE METABOLIC PANEL
ALT: 44 U/L (ref 14–54)
ANION GAP: 7 (ref 5–15)
AST: 37 U/L (ref 15–41)
Albumin: 3.8 g/dL (ref 3.5–5.0)
Alkaline Phosphatase: 57 U/L (ref 38–126)
BILIRUBIN TOTAL: 1.1 mg/dL (ref 0.3–1.2)
BUN: 13 mg/dL (ref 6–20)
CHLORIDE: 96 mmol/L — AB (ref 101–111)
CO2: 24 mmol/L (ref 22–32)
Calcium: 8.8 mg/dL — ABNORMAL LOW (ref 8.9–10.3)
Creatinine, Ser: 0.7 mg/dL (ref 0.44–1.00)
Glucose, Bld: 130 mg/dL — ABNORMAL HIGH (ref 65–99)
POTASSIUM: 4.1 mmol/L (ref 3.5–5.1)
Sodium: 127 mmol/L — ABNORMAL LOW (ref 135–145)
TOTAL PROTEIN: 6.6 g/dL (ref 6.5–8.1)

## 2015-10-22 LAB — URINALYSIS COMPLETE WITH MICROSCOPIC (ARMC ONLY)
BILIRUBIN URINE: NEGATIVE
Bacteria, UA: NONE SEEN
GLUCOSE, UA: 150 mg/dL — AB
HGB URINE DIPSTICK: NEGATIVE
KETONES UR: NEGATIVE mg/dL
NITRITE: NEGATIVE
PH: 5 (ref 5.0–8.0)
Protein, ur: 30 mg/dL — AB
Specific Gravity, Urine: 1.016 (ref 1.005–1.030)

## 2015-10-22 LAB — GLUCOSE, CAPILLARY
Glucose-Capillary: 90 mg/dL (ref 65–99)
Glucose-Capillary: 94 mg/dL (ref 65–99)
Glucose-Capillary: 275 mg/dL — ABNORMAL HIGH (ref 65–99)
Glucose-Capillary: 287 mg/dL — ABNORMAL HIGH (ref 65–99)

## 2015-10-22 LAB — TROPONIN I: TROPONIN I: 0.06 ng/mL — AB (ref <0.031)

## 2015-10-22 LAB — TSH: TSH: 3.017 u[IU]/mL (ref 0.350–4.500)

## 2015-10-22 MED ORDER — ONDANSETRON HCL 4 MG PO TABS: 4.0000 mg | ORAL_TABLET | Freq: Four times a day (QID) | ORAL | Status: DC | PRN

## 2015-10-22 MED ORDER — DEXTROSE 10 % IV SOLN
INTRAVENOUS | Status: DC
  Administered 2015-10-22: 1000 mL via INTRAVENOUS
  Administered 2015-10-23: 04:00:00 via INTRAVENOUS

## 2015-10-22 MED ORDER — DEXTROSE 5 % IV SOLN
1.0000 g | Freq: Once | INTRAVENOUS | Status: DC
  Filled 2015-10-22: qty 10

## 2015-10-22 MED ORDER — ONDANSETRON HCL 4 MG/2ML IJ SOLN
4.0000 mg | Freq: Four times a day (QID) | INTRAMUSCULAR | Status: DC | PRN
  Administered 2015-10-25: 4 mg via INTRAVENOUS
  Filled 2015-10-22: qty 2

## 2015-10-22 MED ORDER — ACETAMINOPHEN 325 MG PO TABS: 650.0000 mg | ORAL_TABLET | Freq: Four times a day (QID) | ORAL | Status: DC | PRN

## 2015-10-22 MED ORDER — DEXTROSE 5 % IV SOLN
1.0000 g | Freq: Once | INTRAVENOUS | Status: AC
  Administered 2015-10-22: 1 g via INTRAVENOUS
  Filled 2015-10-22: qty 10

## 2015-10-22 MED ORDER — SODIUM CHLORIDE 0.9 % IV SOLN
Freq: Once | INTRAVENOUS | Status: AC
  Administered 2015-10-22: 1000 mL via INTRAVENOUS

## 2015-10-22 MED ORDER — ACETAMINOPHEN 650 MG RE SUPP: 650.0000 mg | Freq: Four times a day (QID) | RECTAL | Status: DC | PRN

## 2015-10-22 MED ORDER — ENOXAPARIN SODIUM 40 MG/0.4ML ~~LOC~~ SOLN
40.0000 mg | SUBCUTANEOUS | Status: DC
  Administered 2015-10-22 – 2015-10-26 (×5): 40 mg via SUBCUTANEOUS
  Filled 2015-10-22 (×5): qty 0.4

## 2015-10-22 MED ORDER — DEXTROSE-NACL 5-0.45 % IV SOLN: INTRAVENOUS | Status: DC

## 2015-10-22 MED ORDER — ASPIRIN EC 81 MG PO TBEC
81.0000 mg | DELAYED_RELEASE_TABLET | Freq: Every day | ORAL | Status: DC
  Administered 2015-10-22 – 2015-10-27 (×6): 81 mg via ORAL
  Filled 2015-10-22 (×6): qty 1

## 2015-10-22 NOTE — ED Notes (Signed)
Pt too lethargic to eat lunch tray; admitting MD aware.

## 2015-10-22 NOTE — ED Provider Notes (Signed)
[facility] Emergency Department Provider Note     Time seen: ----------------------------------------- 11:34 AM on 10/22/2015 -----------------------------------------  Level V caveat: Review of systems and history is limited by confusion  I have reviewed the triage vital signs and the nursing notes.   HISTORY  Chief Complaint Altered Mental Status    HPI Doris Evans is a 82 y.o. female who presents ER with complaints of altered mental status for the last several days. Patient was found to have a blood sugar of about 40 on EMS arrival. Husband reports to EMS the patient is insulin-dependent diabetic but he does not check her sugar prior to giving insulin. She did receive insulin but did not eat breakfast, reports blood sugar was 172 after receiving 25 g of D50. Patient is alert to self but otherwise disoriented.   Past Medical History  Diagnosis Date  . Hypertension   . Other specified forms of tremor   . Osteoporosis   . Diabetes mellitus without complication (HCC)   . Hyperlipidemia   . Constipation   . Hematuria   . Proteinuria   . Age-related cognitive decline   . CAD (coronary artery disease)   . Chronic kidney disease     Patient Active Problem List   Diagnosis Date Noted  . Other specified forms of tremor 08/21/2015  . Senile osteoporosis 08/21/2015  . Hyperlipemia 08/21/2015  . Constipated 08/21/2015  . Hematuria 08/21/2015  . Proteinuria 08/21/2015  . Age-related cognitive decline 08/21/2015  . CAD (coronary artery disease) 08/21/2015  . Benign hypertension with chronic kidney disease 08/21/2015  . Diabetes (HCC) 08/21/2015  . CKD (chronic kidney disease), stage II 08/21/2015    Past Surgical History  Procedure Laterality Date  . Appendectomy    . Thyroid nodule removed  2005  . Abdominal hysterectomy  09/12/14    Allergies Review of patient's allergies indicates no known allergies.  Social History Social History   Substance Use Topics  . Smoking status: Never Smoker   . Smokeless tobacco: Never Used  . Alcohol Use: No    Review of Systems Unknown at this time ____________________________________________   PHYSICAL EXAM:  VITAL SIGNS: ED Triage Vitals  Enc Vitals Group     BP 10/22/15 1124 143/94 mmHg     Pulse Rate 10/22/15 1124 90     Resp 10/22/15 1124 22     Temp 10/22/15 1124 97.4 F (36.3 C)     Temp Source 10/22/15 1124 Oral     SpO2 10/22/15 1124 95 %     Weight 10/22/15 1124 110 lb (49.896 kg)     Height 10/22/15 1124 5' (1.524 m)     Head Cir --      Peak Flow --      Pain Score --      Pain Loc --      Pain Edu? --      Excl. in GC? --     Constitutional: Alert but disoriented, no acute distress Eyes: Conjunctivae are normal. PERRL. Normal extraocular movements. ENT   Head: Normocephalic and atraumatic.   Nose: No congestion/rhinnorhea.   Mouth/Throat: Mucous membranes are moist.   Neck: No stridor. Cardiovascular: Normal rate, regular rhythm. Normal and symmetric distal pulses are present in all extremities. No murmurs, rubs, or gallops. Respiratory: Normal respiratory effort without tachypnea nor retractions. Breath sounds are clear and equal bilaterally. No wheezes/rales/rhonchi. Gastrointestinal: Soft and nontender. No distention. No abdominal bruits.  Musculoskeletal: Nontender with normal range of motion  in all extremities. No joint effusions.  No lower extremity tenderness nor edema. Neurologic:  Normal speech and language. No gross focal neurologic deficits are appreciated.  Skin:  Skin is warm, dry and intact. No rash noted. Psychiatric: Mood and affect are normal.  ____________________________________________  EKG: Interpreted by me. Normal sinus rhythm with a rate of 92 bpm, normal PR interval, wide QRS, long QT, left axis deviation, LVH with repolarization abnormality  ____________________________________________  ED COURSE:  Pertinent  labs & imaging results that were available during my care of the patient were reviewed by me and considered in my medical decision making (see chart for details). Patient is no acute distress, will check basic labs and reevaluate. ____________________________________________    LABS (pertinent positives/negatives)  Labs Reviewed  CBC WITH DIFFERENTIAL/PLATELET - Abnormal; Notable for the following:    Hemoglobin 11.6 (*)    RDW 24.4 (*)    Neutro Abs 8.0 (*)    Lymphs Abs 0.9 (*)    All other components within normal limits  COMPREHENSIVE METABOLIC PANEL - Abnormal; Notable for the following:    Sodium 127 (*)    Chloride 96 (*)    Glucose, Bld 130 (*)    Calcium 8.8 (*)    All other components within normal limits  TROPONIN I - Abnormal; Notable for the following:    Troponin I 0.06 (*)    All other components within normal limits  URINALYSIS COMPLETEWITH MICROSCOPIC ([facility] ONLY) - Abnormal; Notable for the following:    Color, Urine YELLOW (*)    APPearance HAZY (*)    Glucose, UA 150 (*)    Protein, ur 30 (*)    Leukocytes, UA 3+ (*)    Squamous Epithelial / LPF 0-5 (*)    All other components within normal limits    RADIOLOGY Images were viewed by me  CT head IMPRESSION: 1. No acute intracranial abnormality. 2. Moderate severe generalized atrophy and severe chronic microvascular ischemic changes of white matter. ____________________________________________  FINAL ASSESSMENT AND PLAN  Altered mental status, hyponatremia, elevated troponin, UTI  Plan: Patient with labs and imaging as dictated above. Patient unfortunately was receiving insulin for her husband without having her blood sugar checked. This subsequent resultant hypoglycemia which has resolved. She will need some saline to replete her salt level, troponin may be demand related. Also give her IV Rocephin to cover her urine infection. I'll recommend observation.   Emily Filbert, MD   Emily Filbert, MD 10/22/15 [phone]

## 2015-10-22 NOTE — ED Notes (Signed)
Pt in via EMS from home w/ complaints of altered mental status x "a couple of days."  Pt blood glucose 40 upon EMS arrival; pt husband reports to EMS that pt is "insulin dependent diabetic" and he does not check her sugar prior to giving insulin.  Pt received morning insulin and did not eat breakfast.  EMS reports blood glucose 172 after receiving 25g D50.  Pt alert to self only.  No immediate distress at this time.  MD at bedside.

## 2015-10-22 NOTE — Progress Notes (Signed)
Admission  Pt was admitted from ED and arrived via stretcher. Pt was able to walk with assistance from stretcher to bed and restroom. Pt was placed in bed with bed alarm on. Pt was unable to answer some of her admission questions, but son arrived and stated her husband would be in tonight and could answer them.  Pt has no pain at this time and admitting glucose was 94.

## 2015-10-22 NOTE — H&P (Signed)
[facility] - [place] at [facility]   PATIENT NAME: Doris Evans    MR#:  [id number]  DATE OF BIRTH:  1928/12/07  DATE OF ADMISSION:  10/22/2015  PRIMARY CARE PHYSICIAN: [name], NP   REQUESTING/REFERRING PHYSICIAN:   CHIEF COMPLAINT:   Chief Complaint  Patient presents with  . Altered Mental Status    HISTORY OF PRESENT ILLNESS: Doris Evans  is a 82 y.o. female with a known history of HTN, DMII,CKD who is brought in by her husband due to patient being confused and weak for the past few days. He reports that she wakes up 4 little bit and then falls asleep easily. And has been confused she has not been able to eat or drink much. He normally gives her insulin and his been giving her insulin. When EMS arrived to check on patient's blood glucose was very low. They gave her appetite D50 with improvement in her blood sugar. Patient on arrival here also noticed to have a urinary tract infection. She is currently confused to provide any history.  PAST MEDICAL HISTORY:   Past Medical History  Diagnosis Date  . Hypertension   . Other specified forms of tremor   . Osteoporosis   . Diabetes mellitus without complication (HCC)   . Hyperlipidemia   . Constipation   . Hematuria   . Proteinuria   . Age-related cognitive decline   . CAD (coronary artery disease)   . Chronic kidney disease     PAST SURGICAL HISTORY: Past Surgical History  Procedure Laterality Date  . Appendectomy    . Thyroid nodule removed  2005  . Abdominal hysterectomy  09/12/14    SOCIAL HISTORY:  Social History  Substance Use Topics  . Smoking status: Never Smoker   . Smokeless tobacco: Never Used  . Alcohol Use: No    FAMILY HISTORY:  Family History  Problem Relation Age of Onset  . Hypertension      DRUG ALLERGIES: No Known Allergies  REVIEW OF SYSTEMS:   CONSTITUTIONAL: Unable to provide due to her mental status MEDICATIONS AT HOME:  Prior to Admission medications    Medication Sig Start Date End Date Taking? Authorizing Provider  aspirin 81 MG tablet Take 81 mg by mouth daily.   Yes Historical Provider, MD  insulin detemir (LEVEMIR) 100 UNIT/ML injection Inject 21 Units into the skin at bedtime.    Yes Historical Provider, MD  losartan (COZAAR) 50 MG tablet Take 1 tablet (50 mg total) by mouth daily. 08/21/15  Yes [name], NP      PHYSICAL EXAMINATION:   VITAL SIGNS: Blood pressure 125/81, pulse 81, temperature 97.4 F (36.3 C), temperature source Oral, resp. rate 30, height 5' (1.524 m), weight 49.896 kg (110 lb), SpO2 98 %.  GENERAL:  82 y.o.-year-old patient lying in the bed with no acute distress.  EYES: Pupils equal, round, reactive to light and accommodation. No scleral icterus. Extraocular muscles intact.  HEENT: Head atraumatic, normocephalic. Oropharynx and nasopharynx clear.  NECK:  Supple, no jugular venous distention. No thyroid enlargement, no tenderness.  LUNGS: Normal breath sounds bilaterally, no wheezing, rales,rhonchi or crepitation. No use of accessory muscles of respiration.  CARDIOVASCULAR: S1, S2 normal. No murmurs, rubs, or gallops.  ABDOMEN: Soft, nontender, nondistended. Bowel sounds present. No organomegaly or mass.  EXTREMITIES: No pedal edema, cyanosis, or clubbing.  NEUROLOGIC: Spontaneously moving all extremities Limited exam  PSYCHIATRIC: Not anxious SKIN: No obvious rash, lesion, or ulcer.   LABORATORY PANEL:  CBC  Recent Labs Lab 10/22/15 1252  WBC 9.7  HGB 11.6*  HCT 36.0  PLT 215  MCV 94.6  MCH 30.5  MCHC 32.3  RDW 24.4*  LYMPHSABS 0.9*  MONOABS 0.7  EOSABS 0.0  BASOSABS 0.1   ------------------------------------------------------------------------------------------------------------------  Chemistries   Recent Labs Lab 10/22/15 1252  NA 127*  K 4.1  CL 96*  CO2 24  GLUCOSE 130*  BUN 13  CREATININE 0.70  CALCIUM 8.8*  AST 37  ALT 44  ALKPHOS 57  BILITOT 1.1    ------------------------------------------------------------------------------------------------------------------ estimated creatinine clearance is 37.6 mL/min (by C-G formula based on Cr of 0.7). ------------------------------------------------------------------------------------------------------------------ No results for input(s): TSH, T4TOTAL, T3FREE, THYROIDAB in the last 72 hours.  Invalid input(s): FREET3   Coagulation profile No results for input(s): INR, PROTIME in the last 168 hours. ------------------------------------------------------------------------------------------------------------------- No results for input(s): DDIMER in the last 72 hours. -------------------------------------------------------------------------------------------------------------------  Cardiac Enzymes  Recent Labs Lab 10/22/15 1252  TROPONINI 0.06*   ------------------------------------------------------------------------------------------------------------------ Invalid input(s): POCBNP  ---------------------------------------------------------------------------------------------------------------  Urinalysis    Component Value Date/Time   COLORURINE YELLOW* 10/22/2015 1310   APPEARANCEUR HAZY* 10/22/2015 1310   LABSPEC 1.016 10/22/2015 1310   PHURINE 5.0 10/22/2015 1310   GLUCOSEU 150* 10/22/2015 1310   HGBUR NEGATIVE 10/22/2015 1310   BILIRUBINUR NEGATIVE 10/22/2015 1310   KETONESUR NEGATIVE 10/22/2015 1310   PROTEINUR 30* 10/22/2015 1310   NITRITE NEGATIVE 10/22/2015 1310   LEUKOCYTESUR 3+* 10/22/2015 1310     RADIOLOGY: Ct Head Wo Contrast  10/22/2015  CLINICAL DATA:  82 year old diabetic presenting with acute mental status changes which began approximately 2 days ago. Serum blood glucose measured 40 upon EMS arrival. Patient oriented only to self. EXAM: CT HEAD WITHOUT CONTRAST TECHNIQUE: Contiguous axial images were obtained from the base of the skull through the vertex  without intravenous contrast. COMPARISON:  None. FINDINGS: Moderate to severe cortical and deep atrophy and moderate cerebellar atrophy. Severe changes of small vessel disease of the white matter diffusely. Physiologic calcifications in the basal ganglia. No mass lesion. No midline shift. No acute hemorrhage or hematoma. No extra-axial fluid collections. No evidence of acute infarction. No skull fracture or other focal osseous abnormality involving the skull. Minimal mucosal thickening in the visualized maxillary sinuses. Remaining visualized paranasal sinuses, bilateral mastoid air cells and bilateral middle ear cavities well-aerated. Severe bilateral carotid siphon and right vertebral artery atherosclerosis. IMPRESSION: 1. No acute intracranial abnormality. 2. Moderate severe generalized atrophy and severe chronic microvascular ischemic changes of white matter. Electronically Signed   By: Hulan Saas M.D.   On: 10/22/2015 13:00    EKG: Orders placed or performed during the hospital encounter of 10/22/15  . ED EKG  . ED EKG  . EKG 12-Lead  . EKG 12-Lead  . EKG 12-Lead  . EKG 12-Lead    IMPRESSION AND PLAN: Patient is a 82 year old brought in with confusion  1. Acute encephalopathy due to combination of urinary tract infection as well as hypoglycemia at this time will treat her hypoglycemia and underlying urinary tract infection. I'll obtain a physical therapy for tomorrow.  2. Hypoglycemia will hold her Levemir now place her on D10. Monitor blood sugar every 2 hours. After 4 occurrences of blood glucose is stable we can change it to every 4 hours.Maryclare Labrador also check a hemoglobin A1c  3. Urinary tract infection: Treated with IV ceftriaxone obtain urine cultures  4. Hyponatremia likely due to dehydration we'll give her IV fluids  5. Hypertension pressure is currently stable  6. Miscellaneous Lovenox for DVT prophylaxis    All the records are reviewed and case discussed with ED  provider. Management plans discussed with the patient, family and they are in agreement.  CODE STATUS: Full    Code Status Orders        Start     Ordered   10/22/15 1504  Full code   Continuous     10/22/15 1504    Code Status History    Date Active Date Inactive Code Status Order ID Comments User Context   This patient has a current code status but no historical code status.       TOTAL TIME TAKING CARE OF THIS PATIENT: .    Auburn Bilberry M.D on 10/22/2015 at 4:22 PM  Between 7am to 6pm - Pager - [phone]  After 6pm go to [email or web address] - password [email or web address] [facility]  [facility]  Office  [phone]  CC: Primary care physician; [name], NP

## 2015-10-23 LAB — GLUCOSE, CAPILLARY
GLUCOSE-CAPILLARY: 207 mg/dL — AB (ref 65–99)
GLUCOSE-CAPILLARY: 274 mg/dL — AB (ref 65–99)
GLUCOSE-CAPILLARY: 295 mg/dL — AB (ref 65–99)
GLUCOSE-CAPILLARY: 314 mg/dL — AB (ref 65–99)
GLUCOSE-CAPILLARY: 360 mg/dL — AB (ref 65–99)
Glucose-Capillary: 326 mg/dL — ABNORMAL HIGH (ref 65–99)
Glucose-Capillary: 358 mg/dL — ABNORMAL HIGH (ref 65–99)
Glucose-Capillary: 371 mg/dL — ABNORMAL HIGH (ref 65–99)
Glucose-Capillary: 229 mg/dL — ABNORMAL HIGH (ref 65–99)

## 2015-10-23 LAB — CBC
HEMATOCRIT: 33.9 % — AB (ref 35.0–47.0)
Hemoglobin: 11.3 g/dL — ABNORMAL LOW (ref 12.0–16.0)
MCH: 31.3 pg (ref 26.0–34.0)
MCHC: 33.5 g/dL (ref 32.0–36.0)
MCV: 93.4 fL (ref 80.0–100.0)
Platelets: 215 10*3/uL (ref 150–440)
RBC: 3.63 MIL/uL — ABNORMAL LOW (ref 3.80–5.20)
RDW: 24.3 % — AB (ref 11.5–14.5)
WBC: 7.5 10*3/uL (ref 3.6–11.0)

## 2015-10-23 LAB — BASIC METABOLIC PANEL
Anion gap: 8 (ref 5–15)
BUN: 10 mg/dL (ref 6–20)
CALCIUM: 8.3 mg/dL — AB (ref 8.9–10.3)
CO2: 20 mmol/L — AB (ref 22–32)
CREATININE: 0.71 mg/dL (ref 0.44–1.00)
Chloride: 89 mmol/L — ABNORMAL LOW (ref 101–111)
GFR calc non Af Amer: >60 mL/min (ref >60)
Glucose, Bld: 342 mg/dL — ABNORMAL HIGH (ref 65–99)
Potassium: 3.9 mmol/L (ref 3.5–5.1)
SODIUM: 117 mmol/L — AB (ref 135–145)

## 2015-10-23 LAB — OSMOLALITY, URINE: OSMOLALITY UR: 576 mosm/kg (ref 300–900)

## 2015-10-23 LAB — SODIUM, URINE, RANDOM

## 2015-10-23 LAB — SODIUM: Sodium: 119 mmol/L — CL (ref 135–145)

## 2015-10-23 MED ORDER — PNEUMOCOCCAL VAC POLYVALENT 25 MCG/0.5ML IJ INJ
0.5000 mL | INJECTION | INTRAMUSCULAR | Status: AC
  Administered 2015-10-24: 0.5 mL via INTRAMUSCULAR
  Filled 2015-10-23: qty 0.5

## 2015-10-23 MED ORDER — INFLUENZA VAC SPLIT QUAD 0.5 ML IM SUSY
0.5000 mL | PREFILLED_SYRINGE | INTRAMUSCULAR | Status: AC
  Administered 2015-10-24: 0.5 mL via INTRAMUSCULAR

## 2015-10-23 MED ORDER — DEXTROSE 5 % IV SOLN
1.0000 g | INTRAVENOUS | Status: DC
  Administered 2015-10-23 – 2015-10-24 (×2): 1 g via INTRAVENOUS
  Filled 2015-10-23 (×2): qty 10

## 2015-10-23 MED ORDER — INSULIN DETEMIR 100 UNIT/ML ~~LOC~~ SOLN
12.0000 [IU] | Freq: Every day | SUBCUTANEOUS | Status: DC
  Administered 2015-10-23 – 2015-10-24 (×2): 12 [IU] via SUBCUTANEOUS
  Filled 2015-10-23 (×3): qty 0.12

## 2015-10-23 MED ORDER — SODIUM CHLORIDE 0.9 % IV SOLN
INTRAVENOUS | Status: DC
  Administered 2015-10-23 – 2015-10-24 (×3): via INTRAVENOUS

## 2015-10-23 NOTE — Progress Notes (Signed)
Spoke with Md regarding critical NA+ value, and insulin coverage. No new orders given at this time, MD will review chart and let me know new plans.

## 2015-10-23 NOTE — Progress Notes (Signed)
Paged Dr. Madelon Lips in reference to pt's critical Na+ 117 and to ask about placing pt on AC HS insulin. MD hasn't called back yet.  Informed the current nurse, Charisse March, in reference to waiting for his call back.

## 2015-10-23 NOTE — Progress Notes (Signed)
Inpatient Diabetes Program Recommendations  AACE/ADA: New Consensus Statement on Inpatient Glycemic Control (2015)  Target Ranges:  Prepandial:   less than 140 mg/dL      Peak postprandial:   less than 180 mg/dL (1-2 hours)      Critically ill patients:  140 - 180 mg/dL  Results for Mones, DEIDREA GAETZ (MRN [id number]) as of 10/23/2015 08:31  Ref. Range 10/22/2015 14:27 10/22/2015 16:53 10/22/2015 19:21 10/22/2015 21:16 10/23/2015 00:24 10/23/2015 01:45 10/23/2015 04:03 10/23/2015 05:57 10/23/2015 07:59  Glucose-Capillary Latest Ref Range: 65-99 mg/dL 90 94 409 (H) 811 (H) 914 (H) 326 (H) 360 (H) 358 (H) 371 (H)   Review of Glycemic Control  Outpatient Diabetes medications: Levemir 21 units QHS Current orders for Inpatient glycemic control: CBGs Q2H  Inpatient Diabetes Program Recommendations: Insulin - Basal: Patient was admitted initially with hypoglycemia which has resolved and glucose is now consistently over 350 mg/dl. Please order Levemir 12 units daily starting now (based on 49 kg x 0.25 units). Correction (SSI): Please consider ordering Novolog 0-9 units Q4H. Diet: Please consider changing diet from Regular to Carb Modified.  Thanks, Orlando Penner, RN, MSN, CDE Diabetes Coordinator Inpatient Diabetes Program [phone] (Team Pager from 8am to 5pm) [phone] (AP office) [phone] [facility] office) [phone] [facility] office)

## 2015-10-23 NOTE — Progress Notes (Signed)
MD paged for critical NA level. Awaiting call back.

## 2015-10-23 NOTE — Evaluation (Signed)
Physical Therapy Evaluation Patient Details Name: Doris Evans MRN: [id number] DOB: [birth year] Today's Date: 10/23/2015   History of Present Illness  Patient is a pleasantly confused 82 y/o female that presents with multiple days of confusion and weakness, found to be dehydrated (low Na+) and UTI.   Clinical Impression  Patient presents from home with multiple days of confusion and weakness. She is able to complete bed mobility, transfers, and ambulation without physical assistance. She is however, confused and unaware of her safety deficits at least in this session. She is a poor historian, however she claims she typically ambulates without RW and appears likely to have done so based on her single leg stance time with RW for UE assistance. She appears physically capable of returning home, though question her cognitive ability to return home without round the clock supervision as she requires directional cues and seems to be below her baseline cognitively. Skilled acute rehab services are indicated to address her mobility deficits.     Follow Up Recommendations Home health PT;Supervision/Assistance - 24 hour    Equipment Recommendations  Rolling walker with 5" wheels    Recommendations for Other Services       Precautions / Restrictions Precautions Precautions: Fall Restrictions Weight Bearing Restrictions: No      Mobility  Bed Mobility Overal bed mobility: Needs Assistance Bed Mobility: Supine to Sit     Supine to sit: Min guard     General bed mobility comments: Patient is able to transfer to the edge of the bed with cga x1, no cuing verbally however prolonged time to complete transfer.   Transfers Overall transfer level: Needs assistance Equipment used: Rolling walker (2 wheeled) Transfers: Sit to/from Stand Sit to Stand: Min guard         General transfer comment: No balance deficits, though physical and verbal cuing provided for hand placement on RW.    Ambulation/Gait Ambulation/Gait assistance: Min guard Ambulation Distance (Feet): 110 Feet Assistive device: Rolling walker (2 wheeled) Gait Pattern/deviations: Decreased step length - right;Decreased step length - left;Drifts right/left;Step-through pattern   Gait velocity interpretation: Below normal speed for age/gender General Gait Details: Patient ambulates with short steps, though no balance deficits noted as she is able to push the IV pole with her foot at one point while exiting the room.   Stairs            Wheelchair Mobility    Modified Rankin (Stroke Patients Only)       Balance Overall balance assessment: Needs assistance Sitting-balance support: No upper extremity supported Sitting balance-Leahy Scale: Fair Sitting balance - Comments: No obvious deficits noted in sitting.    Standing balance support: Bilateral upper extremity supported Standing balance-Leahy Scale: Fair Standing balance comment: No loss of balance noted, able to push IV pole with LLE at one time during room exiting as it got too close to her.                              Pertinent Vitals/Pain Pain Assessment:  (Patient does not appear to express any pain symptoms during this session.)    Home Living Family/patient expects to be discharged to:: Private residence Living Arrangements: Spouse/significant other Available Help at Discharge: Family (Sounds like she lives with husband ) Type of Home: House           Additional Comments: Patient is too confused in this session to adequately provide history details.  Prior Function                 Hand Dominance        Extremity/Trunk Assessment   Upper Extremity Assessment: Overall WFL for tasks assessed           Lower Extremity Assessment: Overall WFL for tasks assessed      Cervical / Trunk Assessment: Kyphotic  Communication   Communication: No difficulties (Intermittently appropriate, though  generally unable to accurately answer questions)  Cognition Arousal/Alertness: Awake/alert Behavior During Therapy: WFL for tasks assessed/performed (Mildly delayed responses) Overall Cognitive Status: Impaired/Different from baseline Area of Impairment: Memory;Following commands;Safety/judgement     Memory: Decreased short-term memory Following Commands: Follows one step commands with increased time Safety/Judgement: Decreased awareness of deficits     General Comments: Patient is slow with responses during this session, though generally appropriate. She is a poor historian currently.     General Comments      Exercises        Assessment/Plan    PT Assessment Patient needs continued PT services  PT Diagnosis Difficulty walking;Generalized weakness;Altered mental status   PT Problem List Decreased strength;Decreased knowledge of use of DME;Decreased balance;Decreased cognition;Decreased mobility;Decreased safety awareness  PT Treatment Interventions DME instruction;Gait training;Stair training;Therapeutic activities;Therapeutic exercise;Balance training   PT Goals (Current goals can be found in the Care Plan section) Acute Rehab PT Goals PT Goal Formulation: Patient unable to participate in goal setting Time For Goal Achievement: 11/06/15 Potential to Achieve Goals: Good    Frequency Min 2X/week   Barriers to discharge Decreased caregiver support Patient unclear about home set up, she will need 24 hour assistance.     Co-evaluation               End of Session Equipment Utilized During Treatment: Gait belt Activity Tolerance: Patient tolerated treatment well Patient left: in chair;with call bell/phone within reach;with chair alarm set Nurse Communication: Mobility status         Time: 1610-9604 PT Time Calculation (min) (ACUTE ONLY): 12 min   Charges:   PT Evaluation $PT Eval Moderate Complexity: 1 Procedure     PT G Codes:       Kerin Ransom,  PT, DPT    10/23/2015, 4:15 PM

## 2015-10-23 NOTE — Progress Notes (Signed)
PT Cancellation Note  Patient Details Name: Doris Evans MRN: [id number] DOB: September 30, 1928   Cancelled Treatment:    Reason Eval/Treat Not Completed: Medical issues which prohibited therapy. PT reviewed chart and noted critical Na+ value of 117. PT will hold mobility evaluation at this time, and follow patient for re-attempt when available/appropriate.   Kerin Ransom, PT, DPT    10/23/2015, 8:22 AM

## 2015-10-23 NOTE — Consult Note (Signed)
[facility]  CONSULT NOTE    Date: 10/23/2015                  Patient Name:  ERCEL NORMOYLE  MRN: [id number]  DOB: 24-Mar-1929  Age / Sex: 83 y.o., female         PCP: [name], NP                 Service Requesting Consult: Dr. Allena Katz                 Reason for Consult: Hyponatremia            History of Present Illness: Ms. Socorro Kanitz Swenson is a 82 y.o. white female with dementia, hypertension, diabetes mellitus type II, hyperlipidemia, coronary artery disease , who was admitted to [facility] on 10/22/2015 for Hyponatremia [E87.1] Hypoglycemia [E16.2] UTI (lower urinary tract infection) [N39.0] Elevated troponin [R79.89]   Patient's family is at bedside. Patient is hard of hearing and with dementia. Unable to get much of a history. Patient was admitted with a  Sodium of 127. Then this morning, it was 117. Normal saline was started. Patient states she is feeling better and family states she does not look dehydrated anymore.    Medications: Outpatient medications: Prescriptions prior to admission  Medication Sig Dispense Refill Last Dose  . aspirin 81 MG tablet Take 81 mg by mouth daily.   should be taking at should be taking  . insulin detemir (LEVEMIR) 100 UNIT/ML injection Inject 21 Units into the skin at bedtime.    10/21/2015 at 1830  . losartan (COZAAR) 50 MG tablet Take 1 tablet (50 mg total) by mouth daily. 90 tablet 3 should be taking at should be taking    Current medications: Current Facility-Administered Medications  Medication Dose Route Frequency Provider Last Rate Last Dose  . 0.9 %  sodium chloride infusion   Intravenous Continuous [name], MD 75 mL/hr at 10/23/15 0844    . acetaminophen (TYLENOL) tablet 650 mg  650 mg Oral Q6H PRN Auburn Bilberry, MD       Or  . acetaminophen (TYLENOL) suppository 650 mg  650 mg Rectal Q6H PRN Auburn Bilberry, MD      . aspirin EC tablet 81 mg  81 mg Oral Daily Auburn Bilberry, MD   81 mg at 10/23/15  1101  . cefTRIAXone (ROCEPHIN) 1 g in dextrose 5 % 50 mL IVPB  1 g Intravenous Q24H [name], MD   1 g at 10/23/15 1101  . enoxaparin (LOVENOX) injection 40 mg  40 mg Subcutaneous Q24H Auburn Bilberry, MD   40 mg at 10/23/15 1627  . [START ON 10/24/2015] Influenza vac split quadrivalent PF (FLUARIX) injection 0.5 mL  0.5 mL Intramuscular Tomorrow-1000 [name], MD      . insulin detemir (LEVEMIR) injection 12 Units  12 Units Subcutaneous QHS [name], MD      . ondansetron (ZOFRAN) tablet 4 mg  4 mg Oral Q6H PRN Auburn Bilberry, MD       Or  . ondansetron (ZOFRAN) injection 4 mg  4 mg Intravenous Q6H PRN Auburn Bilberry, MD      . Melene Muller ON 10/24/2015] pneumococcal 23 valent vaccine (PNU-IMMUNE) injection 0.5 mL  0.5 mL Intramuscular Tomorrow-1000 [name], MD          Allergies: No Known Allergies    Past Medical History: Past Medical History  Diagnosis Date  . Hypertension   . Other specified  forms of tremor   . Osteoporosis   . Diabetes mellitus without complication (HCC)   . Hyperlipidemia   . Constipation   . Hematuria   . Proteinuria   . Age-related cognitive decline   . CAD (coronary artery disease)   . Chronic kidney disease      Past Surgical History: Past Surgical History  Procedure Laterality Date  . Appendectomy    . Thyroid nodule removed  2005  . Abdominal hysterectomy  09/12/14     Family History: Family History  Problem Relation Age of Onset  . Hypertension       Social History: Social History   Social History  . Marital Status: Married    Spouse Name: N/A  . Number of Children: N/A  . Years of Education: N/A   Occupational History  . Not on file.   Social History Main Topics  . Smoking status: Never Smoker   . Smokeless tobacco: Never Used  . Alcohol Use: No  . Drug Use: No  . Sexual Activity: Not on file   Other Topics Concern  . Not on file   Social History Narrative     Review  of Systems: Review of Systems  Unable to perform ROS: dementia    Vital Signs: Blood pressure 133/81, pulse 87, temperature 97.5 F (36.4 C), temperature source Oral, resp. rate 18, height 5' (1.524 m), weight 49.896 kg (110 lb), SpO2 100 %.  Weight trends: Filed Weights   10/22/15 1124  Weight: 49.896 kg (110 lb)    Physical Exam: General: NAD, sitting in chair  Head: Normocephalic, atraumatic. Moist oral mucosal membranes, +Hard of hearing  Eyes: Anicteric, PERRL  Neck: Supple, trachea midline  Lungs:  Clear to auscultation  Heart: Regular rate and rhythm  Abdomen:  Soft, nontender  Extremities: No peripheral edema.  Neurologic: Nonfocal, moving all four extremities  Skin: No lesions        Lab results: Basic Metabolic Panel:  Recent Labs Lab 10/22/15 1252 10/23/15 0535  NA 127* 117*  K 4.1 3.9  CL 96* 89*  CO2 24 20*  GLUCOSE 130* 342*  BUN 13 10  CREATININE 0.70 0.71  CALCIUM 8.8* 8.3*    Liver Function Tests:  Recent Labs Lab 10/22/15 1252  AST 37  ALT 44  ALKPHOS 57  BILITOT 1.1  PROT 6.6  ALBUMIN 3.8   No results for input(s): LIPASE, AMYLASE in the last 168 hours. No results for input(s): AMMONIA in the last 168 hours.  CBC:  Recent Labs Lab 10/22/15 1252 10/23/15 0535  WBC 9.7 7.5  NEUTROABS 8.0*  --   HGB 11.6* 11.3*  HCT 36.0 33.9*  MCV 94.6 93.4  PLT 215 215    Cardiac Enzymes:  Recent Labs Lab 10/22/15 1252  TROPONINI 0.06*    BNP: Invalid input(s): POCBNP  CBG:  Recent Labs Lab 10/23/15 0403 10/23/15 0557 10/23/15 0759 10/23/15 1059 10/23/15 1140  GLUCAP 360* 358* 371* 295* 274*    Microbiology: Results for orders placed or performed during the hospital encounter of 10/22/15  Urine culture     Status: None (Preliminary result)   Collection Time: 10/22/15  1:10 PM  Result Value Ref Range Status   Specimen Description PENDING  Incomplete   Special Requests PENDING  Incomplete   Culture TOO YOUNG TO  READ  Final   Report Status PENDING  Incomplete    Coagulation Studies: No results for input(s): LABPROT, INR in the last 72 hours.  Urinalysis:  Recent Labs  10/22/15 1310  COLORURINE YELLOW*  LABSPEC 1.016  PHURINE 5.0  GLUCOSEU 150*  HGBUR NEGATIVE  BILIRUBINUR NEGATIVE  KETONESUR NEGATIVE  PROTEINUR 30*  NITRITE NEGATIVE  LEUKOCYTESUR 3+*      Imaging: Ct Head Wo Contrast  10/22/2015  CLINICAL DATA:  82 year old diabetic presenting with acute mental status changes which began approximately 2 days ago. Serum blood glucose measured 40 upon EMS arrival. Patient oriented only to self. EXAM: CT HEAD WITHOUT CONTRAST TECHNIQUE: Contiguous axial images were obtained from the base of the skull through the vertex without intravenous contrast. COMPARISON:  None. FINDINGS: Moderate to severe cortical and deep atrophy and moderate cerebellar atrophy. Severe changes of small vessel disease of the white matter diffusely. Physiologic calcifications in the basal ganglia. No mass lesion. No midline shift. No acute hemorrhage or hematoma. No extra-axial fluid collections. No evidence of acute infarction. No skull fracture or other focal osseous abnormality involving the skull. Minimal mucosal thickening in the visualized maxillary sinuses. Remaining visualized paranasal sinuses, bilateral mastoid air cells and bilateral middle ear cavities well-aerated. Severe bilateral carotid siphon and right vertebral artery atherosclerosis. IMPRESSION: 1. No acute intracranial abnormality. 2. Moderate severe generalized atrophy and severe chronic microvascular ischemic changes of white matter. Electronically Signed   By: Hulan Saas M.D.   On: 10/22/2015 13:00      Assessment & Plan: Ms. Quinne Pires Sasaki is a 82 y.o. white female with dementia, hypertension, diabetes mellitus type II, hyperlipidemia, coronary artery disease , who was admitted to [facility] on 10/22/2015   1. Hyponatremia: thought to be due to  hypovolemia? Given normal saline administered. Euvolemic on examination. Not on any diuretics.  - check serum sodium now  2. Chronic Kidney Disease stage III with proteinuria: creatinine and GFR within normal range on lab checks. Most likely diabetic nephropathy.   3. Hypertension: well controlled blood pressure - losartan   LOS: 1 Retta Pitcher 1/25/20175:30 PM

## 2015-10-23 NOTE — Progress Notes (Signed)
Patient's daughter, Kendal Hymen, contacted at patient's request.  Kendal Hymen will be bringing her mother's eye glasses in with her when she comes to visit this AM.  She has looked and cannot find the patient's lower dentures.  Patient aware.

## 2015-10-24 DIAGNOSIS — E11649 Type 2 diabetes mellitus with hypoglycemia without coma: Secondary | ICD-10-CM | POA: Diagnosis not present

## 2015-10-24 LAB — SODIUM
SODIUM: 126 mmol/L — AB (ref 135–145)
SODIUM: 124 mmol/L — AB (ref 135–145)
SODIUM: 119 mmol/L — AB (ref 135–145)

## 2015-10-24 LAB — URINE CULTURE

## 2015-10-24 LAB — GLUCOSE, CAPILLARY
GLUCOSE-CAPILLARY: 117 mg/dL — AB (ref 65–99)
Glucose-Capillary: 114 mg/dL — ABNORMAL HIGH (ref 65–99)
Glucose-Capillary: 168 mg/dL — ABNORMAL HIGH (ref 65–99)
Glucose-Capillary: 173 mg/dL — ABNORMAL HIGH (ref 65–99)
Glucose-Capillary: 202 mg/dL — ABNORMAL HIGH (ref 65–99)

## 2015-10-24 MED ORDER — CEPHALEXIN 500 MG PO CAPS
500.0000 mg | ORAL_CAPSULE | Freq: Two times a day (BID) | ORAL | Status: AC
  Administered 2015-10-25 – 2015-10-26 (×4): 500 mg via ORAL
  Filled 2015-10-24 (×5): qty 1

## 2015-10-24 NOTE — Progress Notes (Signed)
Called for critical lab value sodium 119, this is similar to what was present 24 hours ago Nephrology is on board Patient currently receiving normal saline 75 mL/hour Urine osmolality greater than 500, given euvolemic state currently hold further IV fluid hydration and follow sodium level

## 2015-10-24 NOTE — Progress Notes (Signed)
[facility] - [place] at [facility]   PATIENT NAME: Doris Evans    MR#:  [id number]  DATE OF BIRTH:  [birth year]  SUBJECTIVE:  CHIEF COMPLAINT:   Chief Complaint  Patient presents with  . Altered Mental Status    Came confused, not eating well,a nd low blood sugar.   Noted to have Hyponatremia today, and UTI was present on admission.    Pt is more alert now, but weak. Husband in room.  REVIEW OF SYSTEMS:  CONSTITUTIONAL: No fever, positive for fatigue or weakness.  EYES: No blurred or double vision.  EARS, NOSE, AND THROAT: No tinnitus or ear pain.  RESPIRATORY: No cough, shortness of breath, wheezing or hemoptysis.  CARDIOVASCULAR: No chest pain, orthopnea, edema.  GASTROINTESTINAL: No nausea, vomiting, diarrhea or abdominal pain.  GENITOURINARY: No dysuria, hematuria.  ENDOCRINE: No polyuria, nocturia,  HEMATOLOGY: No anemia, easy bruising or bleeding SKIN: No rash or lesion. MUSCULOSKELETAL: No joint pain or arthritis.   NEUROLOGIC: No tingling, numbness, weakness.  PSYCHIATRY: No anxiety or depression.   ROS  DRUG ALLERGIES:  No Known Allergies  VITALS:  Blood pressure 126/73, pulse 85, temperature 97.5 F (36.4 C), temperature source Oral, resp. rate 18, height 5' (1.524 m), weight 49.896 kg (110 lb), SpO2 100 %.  PHYSICAL EXAMINATION:  GENERAL:  82 y.o.-year-old patient lying in the bed with no acute distress.  EYES: Pupils equal, round, reactive to light and accommodation. No scleral icterus. Extraocular muscles intact.  HEENT: Head atraumatic, normocephalic. Oropharynx and nasopharynx clear.  NECK:  Supple, no jugular venous distention. No thyroid enlargement, no tenderness.  LUNGS: Normal breath sounds bilaterally, no wheezing, rales,rhonchi or crepitation. No use of accessory muscles of respiration.  CARDIOVASCULAR: S1, S2 normal. No murmurs, rubs, or gallops.  ABDOMEN: Soft, nontender, nondistended. Bowel sounds present. No organomegaly  or mass.  EXTREMITIES: No pedal edema, cyanosis, or clubbing.  NEUROLOGIC: Cranial nerves II through XII are intact. Muscle strength 4/5 in all extremities. Sensation intact. Gait not checked.  PSYCHIATRIC: The patient is alert and oriented x 3.  SKIN: No obvious rash, lesion, or ulcer.   Physical Exam LABORATORY PANEL:   CBC  Recent Labs Lab 10/23/15 0535  WBC 7.5  HGB 11.3*  HCT 33.9*  PLT 215   ------------------------------------------------------------------------------------------------------------------  Chemistries   Recent Labs Lab 10/22/15 1252 10/23/15 0535 10/23/15 1725  NA 127* 117* 119*  K 4.1 3.9  --   CL 96* 89*  --   CO2 24 20*  --   GLUCOSE 130* 342*  --   BUN 13 10  --   CREATININE 0.70 0.71  --   CALCIUM 8.8* 8.3*  --   AST 37  --   --   ALT 44  --   --   ALKPHOS 57  --   --   BILITOT 1.1  --   --    ------------------------------------------------------------------------------------------------------------------  Cardiac Enzymes  Recent Labs Lab 10/22/15 1252  TROPONINI 0.06*   ------------------------------------------------------------------------------------------------------------------  RADIOLOGY:  Ct Head Wo Contrast  10/22/2015  CLINICAL DATA:  82 year old diabetic presenting with acute mental status changes which began approximately 2 days ago. Serum blood glucose measured 40 upon EMS arrival. Patient oriented only to self. EXAM: CT HEAD WITHOUT CONTRAST TECHNIQUE: Contiguous axial images were obtained from the base of the skull through the vertex without intravenous contrast. COMPARISON:  None. FINDINGS: Moderate to severe cortical and deep atrophy and moderate cerebellar atrophy. Severe changes of small vessel disease of  the white matter diffusely. Physiologic calcifications in the basal ganglia. No mass lesion. No midline shift. No acute hemorrhage or hematoma. No extra-axial fluid collections. No evidence of acute infarction. No  skull fracture or other focal osseous abnormality involving the skull. Minimal mucosal thickening in the visualized maxillary sinuses. Remaining visualized paranasal sinuses, bilateral mastoid air cells and bilateral middle ear cavities well-aerated. Severe bilateral carotid siphon and right vertebral artery atherosclerosis. IMPRESSION: 1. No acute intracranial abnormality. 2. Moderate severe generalized atrophy and severe chronic microvascular ischemic changes of white matter. Electronically Signed   By: Hulan Saas M.D.   On: 10/22/2015 13:00    ASSESSMENT AND PLAN:   Active Problems:   Acute encephalopathy  1. Acute encephalopathy due to combination of urinary tract infection as well as hypoglycemia    Improved.   2. Hypoglycemia   hold her Levemir , on D10. Monitor blood sugar every 2 hours.   Blood sugar is > 300- stop D10, monitor without insulin coverage.  3. Urinary tract infection:   IV ceftriaxone , urine cultures   Get PT eval.  4. Hyponatremia likely due to dehydration   IV fluids   Check Urine Na and osmolality.    nephro consult.  5. Hypertension pressure is currently stable  6. Miscellaneous Lovenox for DVT prophylaxis    All the records are reviewed and case discussed with Care Management/Social Workerr. Management plans discussed with the patient, family and they are in agreement.  CODE STATUS: full  TOTAL TIME TAKING CARE OF THIS PATIENT: 35 minutes.    POSSIBLE D/C IN 1-2 DAYS, DEPENDING ON CLINICAL CONDITION.   [name] M.D on 10/24/2015   Between 7am to 6pm - Pager - [phone]  After 6pm go to [email or web address] - password [email or web address]  [name]  Office  [phone]  CC: Primary care physician; [name], NP  Note: This dictation was prepared with Dragon dictation along with smaller phrase technology. Any transcriptional errors that result from this process are unintentional.

## 2015-10-24 NOTE — Progress Notes (Signed)
[facility]  ROUNDING NOTE   Subjective:   Na 126 NS at 57  Family at bedside. Patient more alert today  Objective:  Vital signs in last 24 hours:  Temp:  [97.8 F (36.6 C)] 97.8 F (36.6 C) (01/26 1255) Pulse Rate:  [81-85] 81 (01/26 1255) Resp:  [16-18] 16 (01/26 1255) BP: (124-126)/(73-85) 124/85 mmHg (01/26 1255) SpO2:  [99 %-100 %] 99 % (01/26 1255)  Weight change:  Filed Weights   10/22/15 1124  Weight: 49.896 kg (110 lb)    Intake/Output: I/O last 3 completed shifts: In: 3030 [P.O.:240; I.V.:2790] Out: 500 [Urine:500]   Intake/Output this shift:  Total I/O In: 691 [P.O.:240; I.V.:451] Out: -   Physical Exam: General: NAD,   Head: Normocephalic, atraumatic. Moist oral mucosal membranes  Eyes: Anicteric, PERRL  Neck: Supple, trachea midline  Lungs:  Clear to auscultation  Heart: Regular rate and rhythm  Abdomen:  Soft, nontender,   Extremities: no peripheral edema.  Neurologic: Alert to self and place  Skin: No lesions       Basic Metabolic Panel:  Recent Labs Lab 10/22/15 1252 10/23/15 0535 10/23/15 1725 10/24/15 0736 10/24/15 1346  NA 127* 117* 119* 126* 124*  K 4.1 3.9  --   --   --   CL 96* 89*  --   --   --   CO2 24 20*  --   --   --   GLUCOSE 130* 342*  --   --   --   BUN 13 10  --   --   --   CREATININE 0.70 0.71  --   --   --   CALCIUM 8.8* 8.3*  --   --   --     Liver Function Tests:  Recent Labs Lab 10/22/15 1252  AST 37  ALT 44  ALKPHOS 57  BILITOT 1.1  PROT 6.6  ALBUMIN 3.8   No results for input(s): LIPASE, AMYLASE in the last 168 hours. No results for input(s): AMMONIA in the last 168 hours.  CBC:  Recent Labs Lab 10/22/15 1252 10/23/15 0535  WBC 9.7 7.5  NEUTROABS 8.0*  --   HGB 11.6* 11.3*  HCT 36.0 33.9*  MCV 94.6 93.4  PLT 215 215    Cardiac Enzymes:  Recent Labs Lab 10/22/15 1252  TROPONINI 0.06*    BNP: Invalid input(s): POCBNP  CBG:  Recent Labs Lab 10/23/15 1811  10/23/15 2155 10/24/15 0022 10/24/15 0512 10/24/15 1142  GLUCAP 207* 229* 202* 117* 114*    Microbiology: Results for orders placed or performed during the hospital encounter of 10/22/15  Urine culture     Status: None   Collection Time: 10/22/15  1:10 PM  Result Value Ref Range Status   Specimen Description URINE, RANDOM  Final   Special Requests URINE, RANDOM  Final   Culture MULTIPLE SPECIES PRESENT, SUGGEST RECOLLECTION  Final   Report Status 10/24/2015 FINAL  Final    Coagulation Studies: No results for input(s): LABPROT, INR in the last 72 hours.  Urinalysis:  Recent Labs  10/22/15 1310  COLORURINE YELLOW*  LABSPEC 1.016  PHURINE 5.0  GLUCOSEU 150*  HGBUR NEGATIVE  BILIRUBINUR NEGATIVE  KETONESUR NEGATIVE  PROTEINUR 30*  NITRITE NEGATIVE  LEUKOCYTESUR 3+*      Imaging: No results found.   Medications:   . sodium chloride 75 mL/hr at 10/24/15 1244   . aspirin EC  81 mg Oral Daily  . [START ON 10/25/2015] cephALEXin  500  mg Oral Q12H  . enoxaparin (LOVENOX) injection  40 mg Subcutaneous Q24H  . insulin detemir  12 Units Subcutaneous QHS   acetaminophen **OR** acetaminophen, ondansetron **OR** ondansetron (ZOFRAN) IV  Assessment/ Plan:  Doris Evans is a 82 y.o. white female with dementia, hypertension, diabetes mellitus type II, hyperlipidemia, coronary artery disease , who was admitted to [facility] on 10/22/2015   1. Hyponatremia: thought to be due to hypovolemia. Sodium improving.  - Continue normal salineEuvolemic on examination. Not on any diuretics.  - check serum sodium q6.   2. Chronic Kidney Disease stage III with proteinuria: creatinine and GFR within normal range on lab checks. Most likely diabetic nephropathy.   3. Hypertension: well controlled blood pressure - losartan   LOS: 2 Almeter Westhoff 1/26/20174:32 PM

## 2015-10-24 NOTE — Progress Notes (Signed)
[facility] - [place] at [facility]   PATIENT NAME: Doris Evans    MR#:  [id number]  DATE OF BIRTH:  22-Dec-1928  SUBJECTIVE:  CHIEF COMPLAINT:   Chief Complaint  Patient presents with  . Altered Mental Status    Came confused, not eating well, and low blood sugar.   Noted to have Hyponatremia , and UTI was present on admission.    Pt is more alert now, but weak. sister in room.   Na improved some. Bl sugar stable.  REVIEW OF SYSTEMS:  CONSTITUTIONAL: No fever, positive for fatigue or weakness.  EYES: No blurred or double vision.  EARS, NOSE, AND THROAT: No tinnitus or ear pain.  RESPIRATORY: No cough, shortness of breath, wheezing or hemoptysis.  CARDIOVASCULAR: No chest pain, orthopnea, edema.  GASTROINTESTINAL: No nausea, vomiting, diarrhea or abdominal pain.  GENITOURINARY: No dysuria, hematuria.  ENDOCRINE: No polyuria, nocturia,  HEMATOLOGY: No anemia, easy bruising or bleeding SKIN: No rash or lesion. MUSCULOSKELETAL: No joint pain or arthritis.   NEUROLOGIC: No tingling, numbness, weakness.  PSYCHIATRY: No anxiety or depression.   ROS  DRUG ALLERGIES:  No Known Allergies  VITALS:  Blood pressure 152/74, pulse 85, temperature 98.2 F (36.8 C), temperature source Oral, resp. rate 16, height 5' (1.524 m), weight 49.896 kg (110 lb), SpO2 100 %.  PHYSICAL EXAMINATION:  GENERAL:  82 y.o.-year-old patient lying in the bed with no acute distress.  EYES: Pupils equal, round, reactive to light and accommodation. No scleral icterus. Extraocular muscles intact.  HEENT: Head atraumatic, normocephalic. Oropharynx and nasopharynx clear.  NECK:  Supple, no jugular venous distention. No thyroid enlargement, no tenderness.  LUNGS: Normal breath sounds bilaterally, no wheezing, rales,rhonchi or crepitation. No use of accessory muscles of respiration.  CARDIOVASCULAR: S1, S2 normal. No murmurs, rubs, or gallops.  ABDOMEN: Soft, nontender, nondistended. Bowel  sounds present. No organomegaly or mass.  EXTREMITIES: No pedal edema, cyanosis, or clubbing.  NEUROLOGIC: Cranial nerves II through XII are intact. Muscle strength 4/5 in all extremities. Sensation intact. Gait not checked.  PSYCHIATRIC: The patient is alert and oriented x 3.  SKIN: No obvious rash, lesion, or ulcer.   Physical Exam LABORATORY PANEL:   CBC  Recent Labs Lab 10/23/15 0535  WBC 7.5  HGB 11.3*  HCT 33.9*  PLT 215   ------------------------------------------------------------------------------------------------------------------  Chemistries   Recent Labs Lab 10/22/15 1252 10/23/15 0535  10/24/15 1948  NA 127* 117*  < > 119*  K 4.1 3.9  --   --   CL 96* 89*  --   --   CO2 24 20*  --   --   GLUCOSE 130* 342*  --   --   BUN 13 10  --   --   CREATININE 0.70 0.71  --   --   CALCIUM 8.8* 8.3*  --   --   AST 37  --   --   --   ALT 44  --   --   --   ALKPHOS 57  --   --   --   BILITOT 1.1  --   --   --   < > = values in this interval not displayed. ------------------------------------------------------------------------------------------------------------------  Cardiac Enzymes  Recent Labs Lab 10/22/15 1252  TROPONINI 0.06*   ------------------------------------------------------------------------------------------------------------------  RADIOLOGY:  No results found.  ASSESSMENT AND PLAN:   Active Problems:   Acute encephalopathy  1. Acute encephalopathy due to combination of urinary tract infection as well as  hypoglycemia    Improved.   2. Hypoglycemia   hold her Levemir , on D10. Monitor blood sugar every 2 hours.   Blood sugar is > 300- stop D10, monitor without insulin coverage.    Blood sugar stable.  3. Urinary tract infection:   IV ceftriaxone- switch to keflex , multiple bacteria per urine cultures   Get PT eval.  4. Hyponatremia likely due to dehydration   IV fluids   Low  Urine Na and unexpectedly high osmolality.    nephro  consult.   Cont to monitor.  5. Hypertension pressure is currently stable  6. Miscellaneous Lovenox for DVT prophylaxis  PT eval to help discharge plan.  All the records are reviewed and case discussed with Care Management/Social Workerr. Management plans discussed with the patient, family and they are in agreement.  CODE STATUS: full  TOTAL TIME TAKING CARE OF THIS PATIENT: 35 minutes.    POSSIBLE D/C IN 1-2 DAYS, DEPENDING ON CLINICAL CONDITION.   [name] M.D on 10/24/2015   Between 7am to 6pm - Pager - [phone]  After 6pm go to [email or web address] - password [email or web address]  [name]  Office  [phone]  CC: Primary care physician; [name], NP  Note: This dictation was prepared with Dragon dictation along with smaller phrase technology. Any transcriptional errors that result from this process are unintentional.

## 2015-10-24 NOTE — Progress Notes (Signed)
Inpatient Diabetes Program Recommendations  AACE/ADA: New Consensus Statement on Inpatient Glycemic Control (2015)  Target Ranges:  Prepandial:   less than 140 mg/dL      Peak postprandial:   less than 180 mg/dL (1-2 hours)      Critically ill patients:  140 - 180 mg/dL   Review of Glycemic Control  Results for HADLEI, STITT (MRN [id number]) as of 10/24/2015 10:24  Ref. Range 10/23/2015 11:40 10/23/2015 18:11 10/23/2015 21:55 10/24/2015 00:22 10/24/2015 05:12  Glucose-Capillary Latest Ref Range: 65-99 mg/dL 409 (H) 811 (H) 914 (H) 202 (H) 117 (H)    Outpatient Diabetes medications: Levemir 21 units QHS Current orders for Inpatient glycemic control: Levemir 12 units daily   Inpatient Diabetes Program Recommendations:   Please consider ordering Novolog 0-9 units tid and Novolog 0-5 units qhs.  Please consider changing diet from Regular to Carb Modified.  Susette Racer, RN, BA, MHA, CDE Diabetes Coordinator Inpatient Diabetes Program  [phone] (Team Pager) [phone] [facility] Office) 10/24/2015 10:27 AM

## 2015-10-24 NOTE — Care Management Important Message (Signed)
Important Message  Patient Details  Name: Doris Evans MRN: [id number] Date of Birth: Feb 20, 1929   Medicare Important Message Given:  Yes    Olegario Messier A Cyriah Childrey 10/24/2015, 11:06 AM

## 2015-10-25 ENCOUNTER — Inpatient Hospital Stay
Admit: 2015-10-25 | Discharge: 2015-10-25 | Disposition: A | Discharge status: Home / Self Care | Payer: Medicare HMO | Attending: Internal Medicine | Admitting: Internal Medicine

## 2015-10-25 ENCOUNTER — Inpatient Hospital Stay: Payer: Medicare HMO

## 2015-10-25 LAB — GLUCOSE, CAPILLARY
GLUCOSE-CAPILLARY: 71 mg/dL (ref 65–99)
GLUCOSE-CAPILLARY: 119 mg/dL — AB (ref 65–99)
Glucose-Capillary: 83 mg/dL (ref 65–99)
Glucose-Capillary: 155 mg/dL — ABNORMAL HIGH (ref 65–99)
Glucose-Capillary: 147 mg/dL — ABNORMAL HIGH (ref 65–99)

## 2015-10-25 LAB — BASIC METABOLIC PANEL
Anion gap: 10 (ref 5–15)
BUN: 8 mg/dL (ref 6–20)
CALCIUM: 8.7 mg/dL — AB (ref 8.9–10.3)
CO2: 19 mmol/L — AB (ref 22–32)
CREATININE: 0.62 mg/dL (ref 0.44–1.00)
Chloride: 95 mmol/L — ABNORMAL LOW (ref 101–111)
Glucose, Bld: 158 mg/dL — ABNORMAL HIGH (ref 65–99)
Potassium: 4 mmol/L (ref 3.5–5.1)
SODIUM: 124 mmol/L — AB (ref 135–145)

## 2015-10-25 LAB — SODIUM
Sodium: 124 mmol/L — ABNORMAL LOW (ref 135–145)
Sodium: 122 mmol/L — ABNORMAL LOW (ref 135–145)

## 2015-10-25 LAB — AMMONIA

## 2015-10-25 MED ORDER — FUROSEMIDE 10 MG/ML IJ SOLN
40.0000 mg | Freq: Two times a day (BID) | INTRAMUSCULAR | Status: DC
  Administered 2015-10-25 – 2015-10-27 (×4): 40 mg via INTRAVENOUS
  Filled 2015-10-25 (×4): qty 4

## 2015-10-25 MED ORDER — IPRATROPIUM-ALBUTEROL 0.5-2.5 (3) MG/3ML IN SOLN: 3.0000 mL | Freq: Four times a day (QID) | RESPIRATORY_TRACT | Status: DC | PRN

## 2015-10-25 MED ORDER — GLUCERNA SHAKE PO LIQD
237.0000 mL | Freq: Three times a day (TID) | ORAL | Status: DC
  Administered 2015-10-25 – 2015-10-27 (×6): 237 mL via ORAL

## 2015-10-25 MED ORDER — INSULIN DETEMIR 100 UNIT/ML ~~LOC~~ SOLN
10.0000 [IU] | Freq: Every day | SUBCUTANEOUS | Status: DC
  Administered 2015-10-25: 10 [IU] via SUBCUTANEOUS
  Filled 2015-10-25 (×3): qty 0.1

## 2015-10-25 MED ORDER — SODIUM CHLORIDE 1 G PO TABS
1.0000 g | ORAL_TABLET | Freq: Two times a day (BID) | ORAL | Status: DC
  Filled 2015-10-25 (×2): qty 1

## 2015-10-25 MED ORDER — HALOPERIDOL LACTATE 5 MG/ML IJ SOLN
5.0000 mg | Freq: Once | INTRAMUSCULAR | Status: AC
  Administered 2015-10-25: 5 mg via INTRAVENOUS
  Filled 2015-10-25: qty 1

## 2015-10-25 MED ORDER — SODIUM CHLORIDE 1 G PO TABS
1.0000 g | ORAL_TABLET | Freq: Two times a day (BID) | ORAL | Status: DC
  Administered 2015-10-25: 1 g via ORAL
  Filled 2015-10-25 (×5): qty 1

## 2015-10-25 MED ORDER — CARBAMIDE PEROXIDE 6.5 % OT SOLN
5.0000 [drp] | Freq: Two times a day (BID) | OTIC | Status: DC
  Administered 2015-10-25 – 2015-10-27 (×5): 5 [drp] via OTIC
  Filled 2015-10-25: qty 15

## 2015-10-25 NOTE — Progress Notes (Signed)
*  PRELIMINARY RESULTS* Echocardiogram 2D Echocardiogram has been performed. Unable to complete echocardiogram the patient was uncooperative became combative & confused, Limited study was performed.  Garrel Ridgel Stills 10/25/2015, 7:03 PM

## 2015-10-25 NOTE — Progress Notes (Addendum)
Pt was starting to pick at her IV, messing with her telemetry leads and monitor, getting out of bed repeatedly, very confused. Dr. Clint Guy was notified of pt condition, new order to give pt  IV of Haldol once. Will continue to monitor pt.   Doris Evans

## 2015-10-25 NOTE — Care Management (Signed)
Patient admitted from home with Acute encephalopathy.  Patient husband and family friend at bedside.  Patient lives at home with her husband.  History provided by husband.  Obtains medications from [employer].  Patient has a walker at home, husband provides transportation.  Per husband prior to get patient was able to ambulate on her own, and was alert and oriented just hard of hearing.  PT is recommending home health PT.  Husband is very hesitant to initiate home health services.  He states that if she goes home with PT "I only want the ones that use to come out to see me".  I inquired and it was [facility] who had the husband open in the past.  If home health is indicated at the time of discharge will follow up with patient and family and make referral to [facility] at time of discharge.

## 2015-10-25 NOTE — Progress Notes (Signed)
Initial Nutrition Assessment     INTERVENTION:  Meals and snacks: Monitor intake. Will ask kitchen to chop meats, discussed with husband Medical Nutrition Supplement therapy: Recommend glucerna TID for added nutrition Coordination of care: Husband concerned with pt getting weaker and not better. Discussed with Dr. Elpidio Anis and MD to follow up   NUTRITION DIAGNOSIS:   Inadequate oral intake related to acute illness as evidenced by per patient/family report, meal completion < 25%.    GOAL:   Patient will meet greater than or equal to 90% of their needs    MONITOR:    (Energy intake, Electrolyte and renal profile)  REASON FOR ASSESSMENT:   Malnutrition Screening Tool    ASSESSMENT:      Pt admitted with UTI, not eating, AMS  Past Medical History  Diagnosis Date  . Hypertension   . Other specified forms of tremor   . Osteoporosis   . Diabetes mellitus without complication (HCC)   . Hyperlipidemia   . Constipation   . Hematuria   . Proteinuria   . Age-related cognitive decline   . CAD (coronary artery disease)   . Chronic kidney disease     Current Nutrition: few bites eaten this am off breakfast tray. Pt not able to answer questions at this time  Food/Nutrition-Related History: Husband reports poor po intake for 2-3 days prior to admission and since admission.    Scheduled Medications:  . aspirin EC  81 mg Oral Daily  . cephALEXin  500 mg Oral Q12H  . enoxaparin (LOVENOX) injection  40 mg Subcutaneous Q24H  . feeding supplement (GLUCERNA SHAKE)  237 mL Oral TID  . insulin detemir  12 Units Subcutaneous QHS  . sodium chloride  1 g Oral BID WC       Electrolyte/Renal Profile and Glucose Profile:   Recent Labs Lab 10/22/15 1252 10/23/15 0535  10/24/15 1948 10/25/15 0125 10/25/15 0758  NA 127* 117*  < > 119* 124*  124* 122*  K 4.1 3.9  --   --  4.0  --   CL 96* 89*  --   --  95*  --   CO2 24 20*  --   --  19*  --   BUN 13 10  --   --  8  --    CREATININE 0.70 0.71  --   --  0.62  --   CALCIUM 8.8* 8.3*  --   --  8.7*  --   GLUCOSE 130* 342*  --   --  158*  --   < > = values in this interval not displayed. Protein Profile:  Recent Labs Lab 10/22/15 1252  ALBUMIN 3.8    Gastrointestinal Profile: Last BM:1/25   Nutrition-Focused Physical Exam Findings: Nutrition-Focused physical exam completed. Findings are no fat depletion, normal to moderate muscle depletion, and no edema.      Weight Change: noted wt gain prior to admission, husband has no idea if any change in wt    Diet Order:  Diet regular Room service appropriate?: Yes; Fluid consistency:: Thin  Skin:   reviewed   Height:   Ht Readings from Last 1 Encounters:  10/22/15 5' (1.524 m)    Weight:   Wt Readings from Last 1 Encounters:  10/22/15 110 lb (49.896 kg)    Ideal Body Weight:     BMI:  Body mass index is 21.48 kg/(m^2).  Estimated Nutritional Needs:   Kcal:  BEE 871 kcals (IF 1.0-1.3, AF 1.3) 1610-9604 kcals/d  Protein:  (1.0-1.2 gkg) 50-60 g/d  Fluid:  (25-92ml/kg) 1250-1531ml/d  EDUCATION NEEDS:   No education needs identified at this time  MODERATE Care Level  Betul Brisky B. Freida Busman, RD, LDN [phone] (pager) Weekend/On-Call pager [phone])

## 2015-10-25 NOTE — Progress Notes (Signed)
[facility] - [place] [facility]   PATIENT NAME: Doris Evans    MR#:  [id number]  DATE OF BIRTH:  1929-06-07  SUBJECTIVE:  CHIEF COMPLAINT:   Chief Complaint  Patient presents with  . Altered Mental Status    Came confused, not eating well, and low blood sugar.   Noted to have Hyponatremia , and UTI was present on admission.  Still weak. Confused. Decreased hearing - progressive. Poor appetite. Doesn't check blood sugars at home.  REVIEW OF SYSTEMS:  CONSTITUTIONAL: No fever, positive for fatigue or weakness.  EYES: No blurred or double vision.  EARS, NOSE, AND THROAT: No tinnitus or ear pain. Decreased hearing RESPIRATORY: No cough, shortness of breath, wheezing or hemoptysis.  CARDIOVASCULAR: No chest pain, orthopnea, edema.  GASTROINTESTINAL: No nausea, vomiting, diarrhea or abdominal pain.  GENITOURINARY: No dysuria, hematuria.  ENDOCRINE: No polyuria, nocturia,  HEMATOLOGY: No anemia, easy bruising or bleeding SKIN: No rash or lesion. MUSCULOSKELETAL: No joint pain or arthritis.   NEUROLOGIC: No tingling, numbness, weakness. Memory loss PSYCHIATRY: No anxiety or depression.   ROS  DRUG ALLERGIES:  No Known Allergies  VITALS:  Blood pressure 157/95, pulse 100, temperature 98.1 F (36.7 C), temperature source Oral, resp. rate 20, height 5' (1.524 m), weight 49.896 kg (110 lb), SpO2 97 %.  PHYSICAL EXAMINATION:  GENERAL:  82 y.o.-year-old patient lying in the bed with no acute distress.  EYES: Pupils equal, round, reactive to light and accommodation. No scleral icterus. Extraocular muscles intact.  HEENT: Head atraumatic, normocephalic. Oropharynx and nasopharynx clear. B/L Ear wax NECK:  Supple, no jugular venous distention. No thyroid enlargement, no tenderness.  LUNGS: Crackles left base. CARDIOVASCULAR: S1, S2 normal. No murmurs, rubs, or gallops.  ABDOMEN: Soft, nontender, nondistended. Bowel sounds present. No organomegaly or mass.   EXTREMITIES: No pedal edema, cyanosis, or clubbing.  NEUROLOGIC: Cranial nerves II through XII are intact. Muscle strength 4/5 in all extremities. Sensation intact. Gait not checked.  PSYCHIATRIC: The patient is alert and awake. Not oriented to time SKIN: No obvious rash, lesion, or ulcer.   Physical Exam LABORATORY PANEL:   CBC  Recent Labs Lab 10/23/15 0535  WBC 7.5  HGB 11.3*  HCT 33.9*  PLT 215   ------------------------------------------------------------------------------------------------------------------  Chemistries   Recent Labs Lab 10/22/15 1252  10/25/15 0125 10/25/15 0758  NA 127*  < > 124*  124* 122*  K 4.1  < > 4.0  --   CL 96*  < > 95*  --   CO2 24  < > 19*  --   GLUCOSE 130*  < > 158*  --   BUN 13  < > 8  --   CREATININE 0.70  < > 0.62  --   CALCIUM 8.8*  < > 8.7*  --   AST 37  --   --   --   ALT 44  --   --   --   ALKPHOS 57  --   --   --   BILITOT 1.1  --   --   --   < > = values in this interval not displayed. ------------------------------------------------------------------------------------------------------------------  Cardiac Enzymes  Recent Labs Lab 10/22/15 1252  TROPONINI 0.06*   ------------------------------------------------------------------------------------------------------------------  RADIOLOGY:  No results found.  ASSESSMENT AND PLAN:   Active Problems:   Acute encephalopathy  1. Acute encephalopathy due to combination of urinary tract infection as well as hypoglycemia    Improved.  Patient has underlying dementa. Could have  been worsened due to recurrent hypoglycemic episodes. Ct head- Nothing acute.  2. Hypoglycemia with IDDM Levemir started at lower dose. Monitor  3. Urinary tract infection:   IV ceftriaxone- switch to keflex , multiple bacteria per urine cultures  4. Hyponatremia likely due to dehydration and poor PO intake with low solute load.   IV fluids stopped today   Start PO NaCl tabs.     Discuss with Dr. Wynelle Link  5. Hypertension pressure is currently stable  6. Miscellaneous Lovenox for DVT prophylaxis  Check CXR as pt has some crackles.  Started carbamide drops in ear for ear wax.   All the records are reviewed and case discussed with Care Management/Social Workerr. Management plans discussed with the patient, family and they are in agreement.  Discussed in detail with family at bedside regarding patient's dementia and possible recurrent hypoglycemia. Also discussed her lab work and CT scan findings.  CODE STATUS: full  TOTAL TIME TAKING CARE OF THIS PATIENT: 40 minutes.  > 50% time spent on discussion with family at bedside.   POSSIBLE D/C IN 1-2 DAYS, DEPENDING ON CLINICAL CONDITION.   Milagros Loll R M.D on 10/25/2015   Between 7am to 6pm - Pager - [phone]  After 6pm go to [email or web address] - password [email or web address]  [name]  Office  [phone]  CC: Primary care physician; [name], NP  Note: This dictation was prepared with Dragon dictation along with smaller phrase technology. Any transcriptional errors that result from this process are unintentional.

## 2015-10-25 NOTE — Progress Notes (Signed)
10/25/2015 16:45  Pt had an episode of vomiting.  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  I notified MD Sudini because we were previously unaware.  Treated vomiting and nausea with Zofran as ordered.  Will continue to monitor and assess.  Robley Fries, Arelia Longest, RN

## 2015-10-25 NOTE — Progress Notes (Signed)
[facility]  ROUNDING NOTE   Subjective:   Na 122. Off IV normal saline  Objective:  Vital signs in last 24 hours:  Temp:  [97.5 F (36.4 C)-98.2 F (36.8 C)] 97.5 F (36.4 C) (01/27 0437) Pulse Rate:  [81-94] 94 (01/27 0437) Resp:  [16-18] 18 (01/27 0437) BP: (106-152)/(74-85) 106/84 mmHg (01/27 0437) SpO2:  [99 %-100 %] 99 % (01/27 0437)  Weight change:  Filed Weights   10/22/15 1124  Weight: 49.896 kg (110 lb)    Intake/Output: I/O last 3 completed shifts: In: 2101 [P.O.:240; I.V.:1861] Out: 350 [Urine:350]   Intake/Output this shift:     Physical Exam: General: NAD  Head: Normocephalic, atraumatic. Moist oral mucosal membranes, +hard of hearing  Eyes: Anicteric, PERRL  Neck: Supple, trachea midline  Lungs:  Clear to auscultation  Heart: Regular rate and rhythm  Abdomen:  Soft, nontender,   Extremities: no peripheral edema.  Neurologic: Alert to self and place  Skin: No lesions       Basic Metabolic Panel:  Recent Labs Lab 10/22/15 1252 10/23/15 0535  10/24/15 0736 10/24/15 1346 10/24/15 1948 10/25/15 0125 10/25/15 0758  NA 127* 117*  < > 126* 124* 119* 124*  124* 122*  K 4.1 3.9  --   --   --   --  4.0  --   CL 96* 89*  --   --   --   --  95*  --   CO2 24 20*  --   --   --   --  19*  --   GLUCOSE 130* 342*  --   --   --   --  158*  --   BUN 13 10  --   --   --   --  8  --   CREATININE 0.70 0.71  --   --   --   --  0.62  --   CALCIUM 8.8* 8.3*  --   --   --   --  8.7*  --   < > = values in this interval not displayed.  Liver Function Tests:  Recent Labs Lab 10/22/15 1252  AST 37  ALT 44  ALKPHOS 57  BILITOT 1.1  PROT 6.6  ALBUMIN 3.8   No results for input(s): LIPASE, AMYLASE in the last 168 hours. No results for input(s): AMMONIA in the last 168 hours.  CBC:  Recent Labs Lab 10/22/15 1252 10/23/15 0535  WBC 9.7 7.5  NEUTROABS 8.0*  --   HGB 11.6* 11.3*  HCT 36.0 33.9*  MCV 94.6 93.4  PLT 215 215     Cardiac Enzymes:  Recent Labs Lab 10/22/15 1252  TROPONINI 0.06*    BNP: Invalid input(s): POCBNP  CBG:  Recent Labs Lab 10/24/15 0512 10/24/15 1142 10/24/15 1759 10/24/15 2313 10/25/15 0528  GLUCAP 117* 114* 168* 173* 83    Microbiology: Results for orders placed or performed during the hospital encounter of 10/22/15  Urine culture     Status: None   Collection Time: 10/22/15  1:10 PM  Result Value Ref Range Status   Specimen Description URINE, RANDOM  Final   Special Requests URINE, RANDOM  Final   Culture MULTIPLE SPECIES PRESENT, SUGGEST RECOLLECTION  Final   Report Status 10/24/2015 FINAL  Final    Coagulation Studies: No results for input(s): LABPROT, INR in the last 72 hours.  Urinalysis:  Recent Labs  10/22/15 1310  COLORURINE YELLOW*  LABSPEC 1.016  PHURINE 5.0  GLUCOSEU 150*  HGBUR NEGATIVE  BILIRUBINUR NEGATIVE  KETONESUR NEGATIVE  PROTEINUR 30*  NITRITE NEGATIVE  LEUKOCYTESUR 3+*      Imaging: No results found.   Medications:     . aspirin EC  81 mg Oral Daily  . cephALEXin  500 mg Oral Q12H  . enoxaparin (LOVENOX) injection  40 mg Subcutaneous Q24H  . insulin detemir  12 Units Subcutaneous QHS   acetaminophen **OR** acetaminophen, ondansetron **OR** ondansetron (ZOFRAN) IV  Assessment/ Plan:  Ms. Doris Evans is a 82 y.o. white female with dementia, hypertension, diabetes mellitus type II, hyperlipidemia, coronary artery disease , who was admitted to [facility] on 10/22/2015   1. Hyponatremia: thought to be due to hypovolemia. Sodium improving to stable. Euvolemic on examination. Not on any diuretics.  - check serum sodium q6.  - start sodium chloride PO  2. Chronic Kidney Disease stage III with proteinuria: creatinine and GFR within normal range on lab checks. Most likely diabetic nephropathy.   3. Hypertension: well controlled blood pressure - losartan   LOS: 3 Doris Evans 1/27/201710:35 AM

## 2015-10-26 LAB — GLUCOSE, CAPILLARY
GLUCOSE-CAPILLARY: 77 mg/dL (ref 65–99)
GLUCOSE-CAPILLARY: 115 mg/dL — AB (ref 65–99)
Glucose-Capillary: 86 mg/dL (ref 65–99)

## 2015-10-26 LAB — SODIUM: SODIUM: 130 mmol/L — AB (ref 135–145)

## 2015-10-26 MED ORDER — INSULIN DETEMIR 100 UNIT/ML ~~LOC~~ SOLN
8.0000 [IU] | Freq: Every day | SUBCUTANEOUS | Status: DC
  Administered 2015-10-26: 8 [IU] via SUBCUTANEOUS
  Filled 2015-10-26 (×2): qty 0.08

## 2015-10-26 MED ORDER — POTASSIUM CHLORIDE CRYS ER 20 MEQ PO TBCR
40.0000 meq | EXTENDED_RELEASE_TABLET | Freq: Once | ORAL | Status: AC
  Administered 2015-10-26: 40 meq via ORAL
  Filled 2015-10-26: qty 2

## 2015-10-26 NOTE — Progress Notes (Signed)
[facility] - [place] [facility]   PATIENT NAME: Doris Evans    MR#:  [id number]  DATE OF BIRTH:  Aug 21, 1929  SUBJECTIVE:  CHIEF COMPLAINT:   Chief Complaint  Patient presents with  . Altered Mental Status    Came confused, not eating well, and low blood sugar.   Noted to have Hyponatremia , and UTI was present on admission.  Still weak. Confused. Decreased hearing - progressive. Some improvement over yesterday Appetite improving  REVIEW OF SYSTEMS:  CONSTITUTIONAL: No fever, positive for fatigue or weakness.  EYES: No blurred or double vision.  EARS, NOSE, AND THROAT: No tinnitus or ear pain. Decreased hearing RESPIRATORY: No cough, shortness of breath, wheezing or hemoptysis.  CARDIOVASCULAR: No chest pain, orthopnea, edema.  GASTROINTESTINAL: No nausea, vomiting, diarrhea or abdominal pain.  GENITOURINARY: No dysuria, hematuria.  ENDOCRINE: No polyuria, nocturia,  HEMATOLOGY: No anemia, easy bruising or bleeding SKIN: No rash or lesion. MUSCULOSKELETAL: No joint pain or arthritis.   NEUROLOGIC: No tingling, numbness, weakness. Memory loss PSYCHIATRY: No anxiety or depression.   ROS  DRUG ALLERGIES:  No Known Allergies  VITALS:  Blood pressure 119/56, pulse 84, temperature 98.6 F (37 C), temperature source Oral, resp. rate 16, height 5' (1.524 m), weight 49.896 kg (110 lb), SpO2 97 %.  PHYSICAL EXAMINATION:  GENERAL:  82 y.o.-year-old patient lying in the bed with no acute distress.  EYES: Pupils equal, round, reactive to light and accommodation. No scleral icterus. Extraocular muscles intact.  HEENT: Head atraumatic, normocephalic. Oropharynx and nasopharynx clear. B/L Ear wax NECK:  Supple, no jugular venous distention. No thyroid enlargement, no tenderness.  LUNGS: Crackles bilateral base. CARDIOVASCULAR: S1, S2 normal. No murmurs, rubs, or gallops.  ABDOMEN: Soft, nontender, nondistended. Bowel sounds present. No organomegaly or mass.   EXTREMITIES: No pedal edema, cyanosis, or clubbing.  NEUROLOGIC: Cranial nerves II through XII are intact. Muscle strength 4/5 in all extremities. Sensation intact. Gait not checked.  PSYCHIATRIC: The patient is alert and awake. Not oriented to time SKIN: No obvious rash, lesion, or ulcer.   Physical Exam LABORATORY PANEL:   CBC  Recent Labs Lab 10/23/15 0535  WBC 7.5  HGB 11.3*  HCT 33.9*  PLT 215   ------------------------------------------------------------------------------------------------------------------  Chemistries   Recent Labs Lab 10/22/15 1252  10/25/15 0125  10/26/15 0518  NA 127*  < > 124*  124*  < > 130*  K 4.1  < > 4.0  --   --   CL 96*  < > 95*  --   --   CO2 24  < > 19*  --   --   GLUCOSE 130*  < > 158*  --   --   BUN 13  < > 8  --   --   CREATININE 0.70  < > 0.62  --   --   CALCIUM 8.8*  < > 8.7*  --   --   AST 37  --   --   --   --   ALT 44  --   --   --   --   ALKPHOS 57  --   --   --   --   BILITOT 1.1  --   --   --   --   < > = values in this interval not displayed. ------------------------------------------------------------------------------------------------------------------  Cardiac Enzymes  Recent Labs Lab 10/22/15 1252  TROPONINI 0.06*   ------------------------------------------------------------------------------------------------------------------  RADIOLOGY:  Dg Chest 2 View  10/25/2015  CLINICAL DATA:  Wheezing. EXAM: CHEST  2 VIEW COMPARISON:  September 12, 2014. FINDINGS: Stable cardiomediastinal silhouette. Mildly increased interstitial densities are noted throughout both lungs concerning for pulmonary edema. Mild bilateral pleural effusions are noted. Atherosclerosis of descending thoracic aorta is noted. Bony thorax is unremarkable. IMPRESSION: Probable mild diffuse pulmonary edema with mild bilateral pleural effusions. Electronically Signed   By: Lupita Raider, M.D.   On: 10/25/2015 16:25    ASSESSMENT AND PLAN:    Active Problems:   Acute encephalopathy  * Acute on chronic systolic chf EF 25-30% Has pulm edema with pleural effusions on CXR Started on IV lasix. Diuresing well. Cardiology f/u as OP Monitor I/O and Cr, K  1. Acute encephalopathy due to combination of urinary tract infection as well as hypoglycemia    Improved.  Patient has underlying dementa. Could have been worsened due to recurrent hypoglycemic episodes. Ct head- Nothing acute.  2. Hypoglycemia with IDDM Levemir started at lower dose. Monitor  3. Urinary tract infection:   IV ceftriaxone- switch to keflex , multiple bacteria per urine cultures  4. Hyponatremia, hypervolemic Does have chronic mild hyponatremia Continue lasix  5. Hypertension pressure is currently stable  6. Miscellaneous Lovenox for DVT prophylaxis    All the records are reviewed and case discussed with Care Management/Social Workerr. Management plans discussed with the patient, family and they are in agreement.  Discussed in detail with family at bedside regarding patient's dementia and possible recurrent hypoglycemia. Also discussed her lab work and CT scan findings.  CODE STATUS: full  TOTAL TIME TAKING CARE OF THIS PATIENT: 40 minutes.  > 50% time spent on discussion with family at bedside.   POSSIBLE D/C IN 1-2 DAYS, DEPENDING ON CLINICAL CONDITION.   Milagros Loll R M.D on 10/26/2015   Between 7am to 6pm - Pager - [phone]  After 6pm go to [email or web address] - password [email or web address]  [name]  Office  [phone]  CC: Primary care physician; [name], NP  Note: This dictation was prepared with Dragon dictation along with smaller phrase technology. Any transcriptional errors that result from this process are unintentional.

## 2015-10-26 NOTE — Progress Notes (Signed)
[facility]  ROUNDING NOTE   Subjective:   Husband at bedside. Na 130 (122) Hearing better  Objective:  Vital signs in last 24 hours:  Temp:  [97.4 F (36.3 C)-98.1 F (36.7 C)] 98 F (36.7 C) (01/28 0535) Pulse Rate:  [86-100] 95 (01/28 0535) Resp:  [16-20] 16 (01/28 0535) BP: (145-157)/(83-95) 145/84 mmHg (01/28 0535) SpO2:  [93 %-97 %] 96 % (01/28 0535)  Weight change:  Filed Weights   10/22/15 1124  Weight: 49.896 kg (110 lb)    Intake/Output: I/O last 3 completed shifts: In: -  Out: 2850 [Urine:2850]   Intake/Output this shift:  Total I/O In: -  Out: 1100 [Urine:1100]  Physical Exam: General: NAD  Head: Normocephalic, atraumatic. Moist oral mucosal membranes, +hard of hearing  Eyes: Anicteric, PERRL  Neck: Supple, trachea midline  Lungs:  Clear to auscultation  Heart: Regular rate and rhythm  Abdomen:  Soft, nontender,   Extremities: no peripheral edema.  Neurologic: Alert to self and place  Skin: No lesions       Basic Metabolic Panel:  Recent Labs Lab 10/22/15 1252 10/23/15 0535  10/24/15 1346 10/24/15 1948 10/25/15 0125 10/25/15 0758 10/26/15 0518  NA 127* 117*  < > 124* 119* 124*  124* 122* 130*  K 4.1 3.9  --   --   --  4.0  --   --   CL 96* 89*  --   --   --  95*  --   --   CO2 24 20*  --   --   --  19*  --   --   GLUCOSE 130* 342*  --   --   --  158*  --   --   BUN 13 10  --   --   --  8  --   --   CREATININE 0.70 0.71  --   --   --  0.62  --   --   CALCIUM 8.8* 8.3*  --   --   --  8.7*  --   --   < > = values in this interval not displayed.  Liver Function Tests:  Recent Labs Lab 10/22/15 1252  AST 37  ALT 44  ALKPHOS 57  BILITOT 1.1  PROT 6.6  ALBUMIN 3.8   No results for input(s): LIPASE, AMYLASE in the last 168 hours.  Recent Labs Lab 10/25/15 1133  AMMONIA <9*    CBC:  Recent Labs Lab 10/22/15 1252 10/23/15 0535  WBC 9.7 7.5  NEUTROABS 8.0*  --   HGB 11.6* 11.3*  HCT 36.0 33.9*  MCV  94.6 93.4  PLT 215 215    Cardiac Enzymes:  Recent Labs Lab 10/22/15 1252  TROPONINI 0.06*    BNP: Invalid input(s): POCBNP  CBG:  Recent Labs Lab 10/25/15 1323 10/25/15 1755 10/25/15 2115 10/25/15 2318 10/26/15 0540  GLUCAP 71 119* 155* 147* 86    Microbiology: Results for orders placed or performed during the hospital encounter of 10/22/15  Urine culture     Status: None   Collection Time: 10/22/15  1:10 PM  Result Value Ref Range Status   Specimen Description URINE, RANDOM  Final   Special Requests URINE, RANDOM  Final   Culture MULTIPLE SPECIES PRESENT, SUGGEST RECOLLECTION  Final   Report Status 10/24/2015 FINAL  Final    Coagulation Studies: No results for input(s): LABPROT, INR in the last 72 hours.  Urinalysis: No results for input(s): COLORURINE, LABSPEC, PHURINE, GLUCOSEU,  HGBUR, BILIRUBINUR, KETONESUR, PROTEINUR, UROBILINOGEN, NITRITE, LEUKOCYTESUR in the last 72 hours.  Invalid input(s): APPERANCEUR    Imaging: Dg Chest 2 View  10/25/2015  CLINICAL DATA:  Wheezing. EXAM: CHEST  2 VIEW COMPARISON:  September 12, 2014. FINDINGS: Stable cardiomediastinal silhouette. Mildly increased interstitial densities are noted throughout both lungs concerning for pulmonary edema. Mild bilateral pleural effusions are noted. Atherosclerosis of descending thoracic aorta is noted. Bony thorax is unremarkable. IMPRESSION: Probable mild diffuse pulmonary edema with mild bilateral pleural effusions. Electronically Signed   By: Lupita Raider, M.D.   On: 10/25/2015 16:25     Medications:     . aspirin EC  81 mg Oral Daily  . carbamide peroxide  5 drop Both Ears BID  . cephALEXin  500 mg Oral Q12H  . enoxaparin (LOVENOX) injection  40 mg Subcutaneous Q24H  . feeding supplement (GLUCERNA SHAKE)  237 mL Oral TID  . furosemide  40 mg Intravenous BID  . insulin detemir  10 Units Subcutaneous QHS   acetaminophen **OR** acetaminophen, ipratropium-albuterol, ondansetron  **OR** ondansetron (ZOFRAN) IV  Assessment/ Plan:  Ms. Doris Evans is a 82 y.o. white female with dementia, hypertension, diabetes mellitus type II, hyperlipidemia, coronary artery disease , who was admitted to [facility] on 10/22/2015   1. Hyponatremia: thought to be due to hypovolemia. However now improving after starting IV furosemide and 1 dose of sodium chloride.  - check serum sodium serially.   2. Chronic Kidney Disease stage III with proteinuria: creatinine and GFR within normal range on lab checks. Most likely diabetic nephropathy.   3. Hypertension: well controlled blood pressure - losartan   LOS: 4 Doris Evans 1/28/201711:10 AM

## 2015-10-27 LAB — BASIC METABOLIC PANEL
ANION GAP: 7 (ref 5–15)
BUN: 11 mg/dL (ref 6–20)
CALCIUM: 9 mg/dL (ref 8.9–10.3)
CO2: 26 mmol/L (ref 22–32)
Chloride: 96 mmol/L — ABNORMAL LOW (ref 101–111)
Creatinine, Ser: 0.78 mg/dL (ref 0.44–1.00)
GLUCOSE: 94 mg/dL (ref 65–99)
POTASSIUM: 4.1 mmol/L (ref 3.5–5.1)
Sodium: 129 mmol/L — ABNORMAL LOW (ref 135–145)

## 2015-10-27 LAB — GLUCOSE, CAPILLARY
GLUCOSE-CAPILLARY: 102 mg/dL — AB (ref 65–99)
GLUCOSE-CAPILLARY: 194 mg/dL — AB (ref 65–99)

## 2015-10-27 MED ORDER — INSULIN DETEMIR 100 UNIT/ML ~~LOC~~ SOLN: 10.0000 [IU] | Freq: Every day | SUBCUTANEOUS | Status: DC

## 2015-10-27 MED ORDER — FUROSEMIDE 20 MG PO TABS: 20.0000 mg | ORAL_TABLET | Freq: Every day | ORAL | Status: AC

## 2015-10-27 MED ORDER — POTASSIUM CHLORIDE ER 10 MEQ PO TBCR: 10.0000 meq | EXTENDED_RELEASE_TABLET | Freq: Every day | ORAL | Status: AC

## 2015-10-27 NOTE — Progress Notes (Signed)
Pt to be discharged per MD order. IV removed. Instructions reviewed with family and pt. All questions answered. Scripts given to pt. Taken our in wheelchair

## 2015-10-27 NOTE — Progress Notes (Signed)
[facility]  ROUNDING NOTE   Subjective:   Husband at bedside. Na 129  Objective:  Vital signs in last 24 hours:  Temp:  [97.8 F (36.6 C)-98.6 F (37 C)] 97.8 F (36.6 C) (01/29 0515) Pulse Rate:  [84-98] 91 (01/29 0515) Resp:  [16-20] 16 (01/29 0515) BP: (119-132)/(56-82) 132/82 mmHg (01/29 0515) SpO2:  [95 %-100 %] 100 % (01/29 0515)  Weight change:  Filed Weights   10/22/15 1124  Weight: 49.896 kg (110 lb)    Intake/Output: I/O last 3 completed shifts: In: 0  Out: 4425 [Urine:4425]   Intake/Output this shift:  Total I/O In: 0  Out: 600 [Urine:600]  Physical Exam: General: NAD  Head: Normocephalic, atraumatic. Moist oral mucosal membranes, +hard of hearing  Eyes: Anicteric, PERRL  Neck: Supple, trachea midline  Lungs:  Clear to auscultation  Heart: Regular rate and rhythm  Abdomen:  Soft, nontender,   Extremities: no peripheral edema.  Neurologic: Alert to self and place  Skin: No lesions       Basic Metabolic Panel:  Recent Labs Lab 10/22/15 1252 10/23/15 0535  10/24/15 1948 10/25/15 0125 10/25/15 0758 10/26/15 0518 10/27/15 0623  NA 127* 117*  < > 119* 124*  124* 122* 130* 129*  K 4.1 3.9  --   --  4.0  --   --  4.1  CL 96* 89*  --   --  95*  --   --  96*  CO2 24 20*  --   --  19*  --   --  26  GLUCOSE 130* 342*  --   --  158*  --   --  94  BUN 13 10  --   --  8  --   --  11  CREATININE 0.70 0.71  --   --  0.62  --   --  0.78  CALCIUM 8.8* 8.3*  --   --  8.7*  --   --  9.0  < > = values in this interval not displayed.  Liver Function Tests:  Recent Labs Lab 10/22/15 1252  AST 37  ALT 44  ALKPHOS 57  BILITOT 1.1  PROT 6.6  ALBUMIN 3.8   No results for input(s): LIPASE, AMYLASE in the last 168 hours.  Recent Labs Lab 10/25/15 1133  AMMONIA <9*    CBC:  Recent Labs Lab 10/22/15 1252 10/23/15 0535  WBC 9.7 7.5  NEUTROABS 8.0*  --   HGB 11.6* 11.3*  HCT 36.0 33.9*  MCV 94.6 93.4  PLT 215 215     Cardiac Enzymes:  Recent Labs Lab 10/22/15 1252  TROPONINI 0.06*    BNP: Invalid input(s): POCBNP  CBG:  Recent Labs Lab 10/26/15 0540 10/26/15 1154 10/26/15 1813 10/27/15 0005 10/27/15 0517  GLUCAP 86 77 115* 194* 102*    Microbiology: Results for orders placed or performed during the hospital encounter of 10/22/15  Urine culture     Status: None   Collection Time: 10/22/15  1:10 PM  Result Value Ref Range Status   Specimen Description URINE, RANDOM  Final   Special Requests URINE, RANDOM  Final   Culture MULTIPLE SPECIES PRESENT, SUGGEST RECOLLECTION  Final   Report Status 10/24/2015 FINAL  Final    Coagulation Studies: No results for input(s): LABPROT, INR in the last 72 hours.  Urinalysis: No results for input(s): COLORURINE, LABSPEC, PHURINE, GLUCOSEU, HGBUR, BILIRUBINUR, KETONESUR, PROTEINUR, UROBILINOGEN, NITRITE, LEUKOCYTESUR in the last 72 hours.  Invalid input(s): APPERANCEUR  Imaging: Dg Chest 2 View  10/25/2015  CLINICAL DATA:  Wheezing. EXAM: CHEST  2 VIEW COMPARISON:  September 12, 2014. FINDINGS: Stable cardiomediastinal silhouette. Mildly increased interstitial densities are noted throughout both lungs concerning for pulmonary edema. Mild bilateral pleural effusions are noted. Atherosclerosis of descending thoracic aorta is noted. Bony thorax is unremarkable. IMPRESSION: Probable mild diffuse pulmonary edema with mild bilateral pleural effusions. Electronically Signed   By: Lupita Raider, M.D.   On: 10/25/2015 16:25     Medications:     . aspirin EC  81 mg Oral Daily  . carbamide peroxide  5 drop Both Ears BID  . enoxaparin (LOVENOX) injection  40 mg Subcutaneous Q24H  . feeding supplement (GLUCERNA SHAKE)  237 mL Oral TID  . furosemide  40 mg Intravenous BID  . insulin detemir  8 Units Subcutaneous QHS   acetaminophen **OR** acetaminophen, ipratropium-albuterol, ondansetron **OR** ondansetron (ZOFRAN) IV  Assessment/ Plan:  Ms.  Doris Evans is a 82 y.o. white female with dementia, hypertension, diabetes mellitus type II, hyperlipidemia, coronary artery disease , who was admitted to [facility] on 10/22/2015   1. Hyponatremia: hypervolemia secondary to congestive heart failure - sodium now at goal.  - Continue furosemide  2. Chronic Kidney Disease stage III with proteinuria: creatinine and GFR within normal range on lab checks. Most likely diabetic nephropathy.   3. Hypertension: well controlled blood pressure - losartan   LOS: 5 Jaymere Alen 1/29/201711:54 AM

## 2015-10-27 NOTE — Discharge Summary (Signed)
[facility] [place] [facility]   PATIENT NAME: Doris Evans    MR#:  [id number]  DATE OF BIRTH:  Jun 12, 1929  DATE OF ADMISSION:  10/22/2015 ADMITTING PHYSICIAN: Auburn Bilberry, MD  DATE OF DISCHARGE: 10/27/2015  1:35 PM  PRIMARY CARE PHYSICIAN: [name], NP    ADMISSION DIAGNOSIS:  Hyponatremia [E87.1] Hypoglycemia [E16.2] UTI (lower urinary tract infection) [N39.0] Elevated troponin [R79.89]  DISCHARGE DIAGNOSIS:  Active Problems:   Acute encephalopathy   SECONDARY DIAGNOSIS:   Past Medical History  Diagnosis Date  . Hypertension   . Other specified forms of tremor   . Osteoporosis   . Diabetes mellitus without complication (HCC)   . Hyperlipidemia   . Constipation   . Hematuria   . Proteinuria   . Age-related cognitive decline   . CAD (coronary artery disease)   . Chronic kidney disease      ADMITTING HISTORY  HISTORY OF PRESENT ILLNESS: [name] is a 82 y.o. female with a known history of HTN, DMII,CKD who is brought in by her husband due to patient being confused and weak for the past few days. He reports that she wakes up 4 little bit and then falls asleep easily. And has been confused she has not been able to eat or drink much. He normally gives her insulin and his been giving her insulin. When EMS arrived to check on patient's blood glucose was very low. They gave her appetite D50 with improvement in her blood sugar. Patient on arrival here also noticed to have a urinary tract infection. She is currently confused to provide any history.   HOSPITAL COURSE:   * Acute on chronic systolic chf EF 25-30% Has pulm edema with pleural effusions on CXR Started on IV lasix. Diuresing well. Cardiology f/u as OP Monitor I/O and Cr, K Change to oral Lasix daily at discharge.  * Acute encephalopathy due to combination of urinary tract infection as well as hypoglycemia   Improved.  Patient has underlying dementa. Could have been  worsened due to recurrent hypoglycemic episodes. Ct head- Nothing acute.  * Hypoglycemia with IDDM Levemir started at lower dose. Monitor Discharged on 10 units insulin of 21 units which she was taking previously.  * Urinary tract infection:  IV ceftriaxone- switch to keflex , multiple bacteria per urine cultures Finished antibiotic course in the hospital.  * Hyponatremia, hypervolemic Does have chronic mild hyponatremia Continue lasix Seen by nephrology.  * Hypertension pressure is currently stable  Stable for discharge home. Home health set up with nurse for medication changes and CHF.   CONSULTS OBTAINED:  Treatment Team:  Auburn Bilberry, MD Lamont Dowdy, MD  DRUG ALLERGIES:  No Known Allergies  DISCHARGE MEDICATIONS:   Discharge Medication List as of 10/27/2015 12:38 PM    START taking these medications   Details  furosemide (LASIX) 20 MG tablet Take 1 tablet (20 mg total) by mouth daily., Starting 10/27/2015, Until Discontinued, Print    potassium chloride (K-DUR) 10 MEQ tablet Take 1 tablet (10 mEq total) by mouth daily., Starting 10/27/2015, Until Discontinued, Print      CONTINUE these medications which have CHANGED   Details  insulin detemir (LEVEMIR) 100 UNIT/ML injection Inject 0.1 mLs (10 Units total) into the skin at bedtime., Starting 10/27/2015, Until Discontinued, No Print      CONTINUE these medications which have NOT CHANGED   Details  aspirin 81 MG tablet Take 81 mg by mouth daily., Until Discontinued, Historical Med  losartan (COZAAR) 50 MG tablet Take 1 tablet (50 mg total) by mouth daily., Starting 08/21/2015, Until Discontinued, Normal         Today    VITAL SIGNS:  Blood pressure 132/82, pulse 91, temperature 97.8 F (36.6 C), temperature source Oral, resp. rate 16, height 5' (1.524 m), weight 49.896 kg (110 lb), SpO2 100 %.  I/O:   Intake/Output Summary (Last 24 hours) at 10/27/15 1559 Last data filed at 10/27/15 1214   Gross per 24 hour  Intake      0 ml  Output   1300 ml  Net  -1300 ml    PHYSICAL EXAMINATION:  Physical Exam  GENERAL:  82 y.o.-year-old patient lying in the bed with no acute distress.  LUNGS: Normal breath sounds bilaterally, no wheezing, rales,rhonchi or crepitation. No use of accessory muscles of respiration.  CARDIOVASCULAR: S1, S2 normal. No murmurs, rubs, or gallops.  ABDOMEN: Soft, non-tender, non-distended. Bowel sounds present. No organomegaly or mass.  NEUROLOGIC: Moves all 4 extremities. PSYCHIATRIC: The patient is alert and awake. SKIN: No obvious rash, lesion, or ulcer.   DATA REVIEW:   CBC  Recent Labs Lab 10/23/15 0535  WBC 7.5  HGB 11.3*  HCT 33.9*  PLT 215    Chemistries   Recent Labs Lab 10/22/15 1252  10/27/15 0623  NA 127*  < > 129*  K 4.1  < > 4.1  CL 96*  < > 96*  CO2 24  < > 26  GLUCOSE 130*  < > 94  BUN 13  < > 11  CREATININE 0.70  < > 0.78  CALCIUM 8.8*  < > 9.0  AST 37  --   --   ALT 44  --   --   ALKPHOS 57  --   --   BILITOT 1.1  --   --   < > = values in this interval not displayed.  Cardiac Enzymes  Recent Labs Lab 10/22/15 1252  TROPONINI 0.06*    Microbiology Results  Results for orders placed or performed during the hospital encounter of 10/22/15  Urine culture     Status: None   Collection Time: 10/22/15  1:10 PM  Result Value Ref Range Status   Specimen Description URINE, RANDOM  Final   Special Requests URINE, RANDOM  Final   Culture MULTIPLE SPECIES PRESENT, SUGGEST RECOLLECTION  Final   Report Status 10/24/2015 FINAL  Final    RADIOLOGY:  Dg Chest 2 View  10/25/2015  CLINICAL DATA:  Wheezing. EXAM: CHEST  2 VIEW COMPARISON:  September 12, 2014. FINDINGS: Stable cardiomediastinal silhouette. Mildly increased interstitial densities are noted throughout both lungs concerning for pulmonary edema. Mild bilateral pleural effusions are noted. Atherosclerosis of descending thoracic aorta is noted. Bony thorax is  unremarkable. IMPRESSION: Probable mild diffuse pulmonary edema with mild bilateral pleural effusions. Electronically Signed   By: Lupita Raider, M.D.   On: 10/25/2015 16:25      Follow up with PCP in 1 week.  Management plans discussed with the patient, family and they are in agreement.  CODE STATUS:     Code Status Orders        Start     Ordered   10/22/15 1504  Full code   Continuous     10/22/15 1504    Code Status History    Date Active Date Inactive Code Status Order ID Comments User Context   This patient has a current code status but no historical code  status.      TOTAL TIME TAKING CARE OF THIS PATIENT ON DAY OF DISCHARGE: more than 30 minutes.    Milagros Loll R M.D on 10/27/2015 at 3:59 PM  Between 7am to 6pm - Pager - [phone]  After 6pm go to [email or web address] - password [email or web address]  [name]  Office  [phone]  CC: Primary care physician; [name], NP   Note: This dictation was prepared with Dragon dictation along with smaller phrase technology. Any transcriptional errors that result from this process are unintentional.

## 2015-10-27 NOTE — Discharge Instructions (Signed)
°  DIET:  Cardiac diet and Diabetic diet  DISCHARGE CONDITION:  Stable  ACTIVITY:  Activity as tolerated  OXYGEN:  Home Oxygen: No.   Oxygen Delivery: room air  DISCHARGE LOCATION:  home   If you experience worsening of your admission symptoms, develop shortness of breath, life threatening emergency, suicidal or homicidal thoughts you must seek medical attention immediately by calling 911 or calling your MD immediately  if symptoms less severe.  You Must read complete instructions/literature along with all the possible adverse reactions/side effects for all the Medicines you take and that have been prescribed to you. Take any new Medicines after you have completely understood and accpet all the possible adverse reactions/side effects.   Please note  You were cared for by a hospitalist during your hospital stay. If you have any questions about your discharge medications or the care you received while you were in the hospital after you are discharged, you can call the unit and asked to speak with the hospitalist on call if the hospitalist that took care of you is not available. Once you are discharged, your primary care physician will handle any further medical issues. Please note that NO REFILLS for any discharge medications will be authorized once you are discharged, as it is imperative that you return to your primary care physician (or establish a relationship with a primary care physician if you do not have one) for your aftercare needs so that they can reassess your need for medications and monitor your lab values.    - Daily fluids < 2 liters. - Low salt diet - Check weight everyday and keep log. Take to your doctors appt. - Take extra dose of lasix if you gain more than 3 pounds weight.

## 2015-11-01 ENCOUNTER — Telehealth: Payer: Self-pay

## 2015-11-01 ENCOUNTER — Ambulatory Visit (INDEPENDENT_AMBULATORY_CARE_PROVIDER_SITE_OTHER): Payer: Medicare HMO | Admitting: Unknown Physician Specialty

## 2015-11-01 ENCOUNTER — Encounter: Payer: Self-pay | Admitting: Unknown Physician Specialty

## 2015-11-01 VITALS — BP 135/82 | HR 90 | Temp 97.4°F | Ht <= 58 in | Wt 106.6 lb

## 2015-11-01 DIAGNOSIS — E162 Hypoglycemia, unspecified: Secondary | ICD-10-CM

## 2015-11-01 DIAGNOSIS — F0390 Unspecified dementia without behavioral disturbance: Secondary | ICD-10-CM | POA: Diagnosis not present

## 2015-11-01 DIAGNOSIS — N3000 Acute cystitis without hematuria: Secondary | ICD-10-CM

## 2015-11-01 DIAGNOSIS — E871 Hypo-osmolality and hyponatremia: Secondary | ICD-10-CM | POA: Diagnosis not present

## 2015-11-01 DIAGNOSIS — Z7189 Other specified counseling: Secondary | ICD-10-CM | POA: Diagnosis not present

## 2015-11-01 DIAGNOSIS — G4700 Insomnia, unspecified: Secondary | ICD-10-CM | POA: Diagnosis not present

## 2015-11-01 MED ORDER — BLOOD GLUCOSE MONITOR KIT: PACK | Status: DC

## 2015-11-01 NOTE — Assessment & Plan Note (Signed)
Discussed sleep hygeine instead of pills.  Discussed adult care services to keep her busy and awake.

## 2015-11-01 NOTE — Assessment & Plan Note (Signed)
Check CMP.  ?

## 2015-11-01 NOTE — Patient Instructions (Addendum)
check fasting blood sugar at least 3 days/week Avoid sleeping during the day  Use baby oil both ears and recheck 1 week

## 2015-11-01 NOTE — Telephone Encounter (Signed)
Faxed blood glucose prescription to pharmacy.

## 2015-11-01 NOTE — Assessment & Plan Note (Signed)
?   Lewie body.  Refer to neurology

## 2015-11-01 NOTE — Progress Notes (Signed)
BP 135/82 mmHg  Pulse 90  Temp(Src) 97.4 F (36.3 C)  Ht 4' 9.2" (1.453 m)  Wt 106 lb 9.6 oz (48.353 kg)  BMI 22.90 kg/m2  SpO2 94%  LMP  (LMP Unknown)   Subjective:    Patient ID: Doris Evans, female    DOB: 04/28/1929, 82 y.o.   MRN: [id number]  HPI: Doris Evans is a 82 y.o. female  Chief Complaint  Patient presents with  . Hospitalization Follow-up    pt's husband states she was seen at ER and they said that pt needed to have ear's cleaned out  . Medication Refill    pt's husband states pt is not sleeping and needs refill on trazadone   Hospital notes reviewed.  She had a UTI and was found to have a low blood sugar.  Levimir decreased to 10 units from 21 units.  Neither she or her husband checks her blood sugar.    Hyponatremia - chronic issue.  Needs some f/u  Insomnia - Husband wants sleeping pills for her.    Hard of hearing: wants ears cleaned out  Tremors/dementia Audio hallucinatins.  Often doesn't know where she is.  ? Lewie Body dementia with her tremors   Relevant past medical, surgical, family and social history reviewed and updated as indicated. Interim medical history since our last visit reviewed. Allergies and medications reviewed and updated.  Review of Systems  Per HPI unless specifically indicated above     Objective:    BP 135/82 mmHg  Pulse 90  Temp(Src) 97.4 F (36.3 C)  Ht 4' 9.2" (1.453 m)  Wt 106 lb 9.6 oz (48.353 kg)  BMI 22.90 kg/m2  SpO2 94%  LMP  (LMP Unknown)  Wt Readings from Last 3 Encounters:  11/01/15 106 lb 9.6 oz (48.353 kg)  10/22/15 110 lb (49.896 kg)  08/21/15 105 lb 3.2 oz (47.718 kg)    Physical Exam  Constitutional: She is oriented to person, place, and time. She appears well-developed and well-nourished. No distress.  HENT:  Head: Normocephalic and atraumatic.  Right Ear: Decreased hearing is noted.  Left Ear: Decreased hearing is noted.  Bilateral cerumen.  Unable to clean out with irrigation  Eyes:  Conjunctivae and lids are normal. Right eye exhibits no discharge. Left eye exhibits no discharge. No scleral icterus.  Neck: Normal range of motion. Neck supple. No JVD present. Carotid bruit is not present.  Cardiovascular: Normal rate, regular rhythm and normal heart sounds.   Pulmonary/Chest: Effort normal and breath sounds normal.  Abdominal: Normal appearance. There is no splenomegaly or hepatomegaly.  Musculoskeletal: Normal range of motion.  Neurological: She is alert and oriented to person, place, and time.  Skin: Skin is warm, dry and intact. No rash noted. No pallor.  Psychiatric: She has a normal mood and affect. Her behavior is normal. Judgment and thought content normal.    Results for orders placed or performed during the hospital encounter of 10/22/15  Urine culture  Result Value Ref Range   Specimen Description URINE, RANDOM    Special Requests URINE, RANDOM    Culture MULTIPLE SPECIES PRESENT, SUGGEST RECOLLECTION    Report Status 10/24/2015 FINAL   CBC with Differential  Result Value Ref Range   WBC 9.7 3.6 - 11.0 K/uL   RBC 3.80 3.80 - 5.20 MIL/uL   Hemoglobin 11.6 (L) 12.0 - 16.0 g/dL   HCT 11.9 14.7 - 82.9 %   MCV 94.6 80.0 - 100.0 fL   MCH  30.5 26.0 - 34.0 pg   MCHC 32.3 32.0 - 36.0 g/dL   RDW 40.9 (H) 81.1 - 91.4 %   Platelets 215 150 - 440 K/uL   Neutrophils Relative % 82 %   Neutro Abs 8.0 (H) 1.4 - 6.5 K/uL   Lymphocytes Relative 10 %   Lymphs Abs 0.9 (L) 1.0 - 3.6 K/uL   Monocytes Relative 7 %   Monocytes Absolute 0.7 0.2 - 0.9 K/uL   Eosinophils Relative 0 %   Eosinophils Absolute 0.0 0 - 0.7 K/uL   Basophils Relative 1 %   Basophils Absolute 0.1 0 - 0.1 K/uL  Comprehensive metabolic panel  Result Value Ref Range   Sodium 127 (L) 135 - 145 mmol/L   Potassium 4.1 3.5 - 5.1 mmol/L   Chloride 96 (L) 101 - 111 mmol/L   CO2 24 22 - 32 mmol/L   Glucose, Bld 130 (H) 65 - 99 mg/dL   BUN 13 6 - 20 mg/dL   Creatinine, Ser 7.82 0.44 - 1.00 mg/dL    Calcium 8.8 (L) 8.9 - 10.3 mg/dL   Total Protein 6.6 6.5 - 8.1 g/dL   Albumin 3.8 3.5 - 5.0 g/dL   AST 37 15 - 41 U/L   ALT 44 14 - 54 U/L   Alkaline Phosphatase 57 38 - 126 U/L   Total Bilirubin 1.1 0.3 - 1.2 mg/dL   GFR calc non Af Amer >60 >60 mL/min   GFR calc Af Amer >60 >60 mL/min   Anion gap 7 5 - 15  Troponin I  Result Value Ref Range   Troponin I 0.06 (H) <0.031 ng/mL  Urinalysis complete, with microscopic  Result Value Ref Range   Color, Urine YELLOW (A) YELLOW   APPearance HAZY (A) CLEAR   Glucose, UA 150 (A) NEGATIVE mg/dL   Bilirubin Urine NEGATIVE NEGATIVE   Ketones, ur NEGATIVE NEGATIVE mg/dL   Specific Gravity, Urine 1.016 1.005 - 1.030   Hgb urine dipstick NEGATIVE NEGATIVE   pH 5.0 5.0 - 8.0   Protein, ur 30 (A) NEGATIVE mg/dL   Nitrite NEGATIVE NEGATIVE   Leukocytes, UA 3+ (A) NEGATIVE   RBC / HPF 6-30 0 - 5 RBC/hpf   WBC, UA TOO NUMEROUS TO COUNT 0 - 5 WBC/hpf   Bacteria, UA NONE SEEN NONE SEEN   Squamous Epithelial / LPF 0-5 (A) NONE SEEN   Mucous PRESENT   Glucose, capillary  Result Value Ref Range   Glucose-Capillary 90 65 - 99 mg/dL   Comment 1 Notify RN   TSH  Result Value Ref Range   TSH 3.017 0.350 - 4.500 uIU/mL  Glucose, capillary  Result Value Ref Range   Glucose-Capillary 94 65 - 99 mg/dL  CBC  Result Value Ref Range   WBC 7.5 3.6 - 11.0 K/uL   RBC 3.63 (L) 3.80 - 5.20 MIL/uL   Hemoglobin 11.3 (L) 12.0 - 16.0 g/dL   HCT 95.6 (L) 21.3 - 08.6 %   MCV 93.4 80.0 - 100.0 fL   MCH 31.3 26.0 - 34.0 pg   MCHC 33.5 32.0 - 36.0 g/dL   RDW 57.8 (H) 46.9 - 62.9 %   Platelets 215 150 - 440 K/uL  Basic metabolic panel  Result Value Ref Range   Sodium 117 (LL) 135 - 145 mmol/L   Potassium 3.9 3.5 - 5.1 mmol/L   Chloride 89 (L) 101 - 111 mmol/L   CO2 20 (L) 22 - 32 mmol/L   Glucose, Bld 342 (  H) 65 - 99 mg/dL   BUN 10 6 - 20 mg/dL   Creatinine, Ser 1.61 0.44 - 1.00 mg/dL   Calcium 8.3 (L) 8.9 - 10.3 mg/dL   GFR calc non Af Amer >60 >60  mL/min   GFR calc Af Amer >60 >60 mL/min   Anion gap 8 5 - 15  Glucose, capillary  Result Value Ref Range   Glucose-Capillary 275 (H) 65 - 99 mg/dL  Glucose, capillary  Result Value Ref Range   Glucose-Capillary 287 (H) 65 - 99 mg/dL   Comment 1 Notify RN   Glucose, capillary  Result Value Ref Range   Glucose-Capillary 314 (H) 65 - 99 mg/dL   Comment 1 Notify RN   Glucose, capillary  Result Value Ref Range   Glucose-Capillary 326 (H) 65 - 99 mg/dL  Glucose, capillary  Result Value Ref Range   Glucose-Capillary 360 (H) 65 - 99 mg/dL  Glucose, capillary  Result Value Ref Range   Glucose-Capillary 358 (H) 65 - 99 mg/dL   Comment 1 Notify RN   Glucose, capillary  Result Value Ref Range   Glucose-Capillary 371 (H) 65 - 99 mg/dL   Comment 1 Notify RN   Sodium, urine, random  Result Value Ref Range   Sodium, Ur <10 mmol/L  Osmolality, urine  Result Value Ref Range   Osmolality, Ur 576 300 - 900 mOsm/kg  Glucose, capillary  Result Value Ref Range   Glucose-Capillary 295 (H) 65 - 99 mg/dL  Glucose, capillary  Result Value Ref Range   Glucose-Capillary 274 (H) 65 - 99 mg/dL   Comment 1 Notify RN   Sodium  Result Value Ref Range   Sodium 119 (LL) 135 - 145 mmol/L  Glucose, capillary  Result Value Ref Range   Glucose-Capillary 207 (H) 65 - 99 mg/dL   Comment 1 Notify RN   Glucose, capillary  Result Value Ref Range   Glucose-Capillary 229 (H) 65 - 99 mg/dL  Glucose, capillary  Result Value Ref Range   Glucose-Capillary 202 (H) 65 - 99 mg/dL   Comment 1 Notify RN   Glucose, capillary  Result Value Ref Range   Glucose-Capillary 117 (H) 65 - 99 mg/dL   Comment 1 Notify RN   Sodium  Result Value Ref Range   Sodium 126 (L) 135 - 145 mmol/L  Sodium  Result Value Ref Range   Sodium 124 (L) 135 - 145 mmol/L  Sodium  Result Value Ref Range   Sodium 119 (LL) 135 - 145 mmol/L  Glucose, capillary  Result Value Ref Range   Glucose-Capillary 114 (H) 65 - 99 mg/dL  Sodium   Result Value Ref Range   Sodium 124 (L) 135 - 145 mmol/L  Basic metabolic panel  Result Value Ref Range   Sodium 124 (L) 135 - 145 mmol/L   Potassium 4.0 3.5 - 5.1 mmol/L   Chloride 95 (L) 101 - 111 mmol/L   CO2 19 (L) 22 - 32 mmol/L   Glucose, Bld 158 (H) 65 - 99 mg/dL   BUN 8 6 - 20 mg/dL   Creatinine, Ser 0.96 0.44 - 1.00 mg/dL   Calcium 8.7 (L) 8.9 - 10.3 mg/dL   GFR calc non Af Amer >60 >60 mL/min   GFR calc Af Amer >60 >60 mL/min   Anion gap 10 5 - 15  Glucose, capillary  Result Value Ref Range   Glucose-Capillary 168 (H) 65 - 99 mg/dL  Sodium  Result Value Ref Range   Sodium  122 (L) 135 - 145 mmol/L  Glucose, capillary  Result Value Ref Range   Glucose-Capillary 173 (H) 65 - 99 mg/dL  Glucose, capillary  Result Value Ref Range   Glucose-Capillary 83 65 - 99 mg/dL  Ammonia  Result Value Ref Range   Ammonia <9 (L) 9 - 35 umol/L  Glucose, capillary  Result Value Ref Range   Glucose-Capillary 71 65 - 99 mg/dL   Comment 1 Notify RN   Sodium  Result Value Ref Range   Sodium 130 (L) 135 - 145 mmol/L  Glucose, capillary  Result Value Ref Range   Glucose-Capillary 155 (H) 65 - 99 mg/dL  Glucose, capillary  Result Value Ref Range   Glucose-Capillary 119 (H) 65 - 99 mg/dL   Comment 1 Notify RN   Glucose, capillary  Result Value Ref Range   Glucose-Capillary 147 (H) 65 - 99 mg/dL  Glucose, capillary  Result Value Ref Range   Glucose-Capillary 86 65 - 99 mg/dL  Glucose, capillary  Result Value Ref Range   Glucose-Capillary 77 65 - 99 mg/dL  Basic metabolic panel  Result Value Ref Range   Sodium 129 (L) 135 - 145 mmol/L   Potassium 4.1 3.5 - 5.1 mmol/L   Chloride 96 (L) 101 - 111 mmol/L   CO2 26 22 - 32 mmol/L   Glucose, Bld 94 65 - 99 mg/dL   BUN 11 6 - 20 mg/dL   Creatinine, Ser 1.61 0.44 - 1.00 mg/dL   Calcium 9.0 8.9 - 09.6 mg/dL   GFR calc non Af Amer >60 >60 mL/min   GFR calc Af Amer >60 >60 mL/min   Anion gap 7 5 - 15  Glucose, capillary  Result  Value Ref Range   Glucose-Capillary 115 (H) 65 - 99 mg/dL  Glucose, capillary  Result Value Ref Range   Glucose-Capillary 194 (H) 65 - 99 mg/dL  Glucose, capillary  Result Value Ref Range   Glucose-Capillary 102 (H) 65 - 99 mg/dL      Assessment & Plan:   Problem List Items Addressed This Visit      Unprioritized   Encounter for glucometer instruction - Primary   Insomnia    Discussed sleep hygeine instead of pills.  Discussed adult care services to keep her busy and awake.        Hyponatremia    Check CMP      Relevant Orders   Comprehensive metabolic panel   Dementia    ? Lewie body.  Refer to neurology      Relevant Medications   traZODone (DESYREL) 50 MG tablet   Other Relevant Orders   Ambulatory referral to Neurology    Other Visit Diagnoses    Hypoglycemia        check fasting blood sugar at least 3 days/week    Acute cystitis without hematuria        Check C&S    Relevant Orders    UA/M w/rflx Culture, Routine       Information given for adult daycare Follow up plan: Return in about 1 week (around 11/08/2015).

## 2015-11-02 LAB — MICROSCOPIC EXAMINATION

## 2015-11-02 LAB — COMPREHENSIVE METABOLIC PANEL
ALK PHOS: 57 IU/L (ref 39–117)
ALT: 35 IU/L — AB (ref 0–32)
AST: 30 IU/L (ref 0–40)
Albumin/Globulin Ratio: 1.6 (ref 1.1–2.5)
Albumin: 3.9 g/dL (ref 3.5–4.7)
BUN/Creatinine Ratio: 16 (ref 11–26)
BUN: 12 mg/dL (ref 8–27)
Bilirubin Total: 0.9 mg/dL (ref 0.0–1.2)
CALCIUM: 9.4 mg/dL (ref 8.7–10.3)
CO2: 25 mmol/L (ref 18–29)
CREATININE: 0.76 mg/dL (ref 0.57–1.00)
Chloride: 92 mmol/L — ABNORMAL LOW (ref 96–106)
GFR calc Af Amer: 83 mL/min/{1.73_m2} (ref >59)
GFR calc non Af Amer: 72 mL/min/{1.73_m2} (ref >59)
GLUCOSE: 184 mg/dL — AB (ref 65–99)
Globulin, Total: 2.5 g/dL (ref 1.5–4.5)
Potassium: 4.7 mmol/L (ref 3.5–5.2)
SODIUM: 133 mmol/L — AB (ref 134–144)
Total Protein: 6.4 g/dL (ref 6.0–8.5)

## 2015-11-02 LAB — UA/M W/RFLX CULTURE, ROUTINE
BILIRUBIN UA: NEGATIVE
Glucose, UA: NEGATIVE
KETONES UA: NEGATIVE
Nitrite, UA: NEGATIVE
PH UA: 7 (ref 5.0–7.5)
Protein, UA: NEGATIVE
Specific Gravity, UA: 1.015 (ref 1.005–1.030)
Urobilinogen, Ur: 1 mg/dL (ref 0.2–1.0)

## 2015-11-02 LAB — URINE CULTURE, REFLEX

## 2015-11-08 ENCOUNTER — Encounter: Payer: Self-pay | Admitting: Unknown Physician Specialty

## 2015-11-08 ENCOUNTER — Ambulatory Visit (INDEPENDENT_AMBULATORY_CARE_PROVIDER_SITE_OTHER): Payer: Medicare HMO | Admitting: Unknown Physician Specialty

## 2015-11-08 VITALS — BP 141/89 | HR 88 | Temp 96.2°F | Ht <= 58 in | Wt 106.0 lb

## 2015-11-08 DIAGNOSIS — N898 Other specified noninflammatory disorders of vagina: Secondary | ICD-10-CM | POA: Diagnosis not present

## 2015-11-08 DIAGNOSIS — H6123 Impacted cerumen, bilateral: Secondary | ICD-10-CM

## 2015-11-08 DIAGNOSIS — F0390 Unspecified dementia without behavioral disturbance: Secondary | ICD-10-CM | POA: Diagnosis not present

## 2015-11-08 MED ORDER — DONEPEZIL HCL 5 MG PO TABS: 5.0000 mg | ORAL_TABLET | Freq: Every day | ORAL | Status: AC

## 2015-11-08 MED ORDER — CLINDAMYCIN PHOSPHATE 2 % VA CREA: 1.0000 | TOPICAL_CREAM | Freq: Every day | VAGINAL | Status: DC

## 2015-11-08 NOTE — Progress Notes (Signed)
BP 141/89 mmHg  Pulse 88  Temp(Src) 96.2 F (35.7 C)  Ht 4' 9.3" (1.455 m)  Wt 106 lb (48.081 kg)  BMI 22.71 kg/m2  SpO2 96%  LMP  (LMP Unknown)   Subjective:    Patient ID: Doris Evans, female    DOB: May 21, 1929, 82 y.o.   MRN: [id number]  HPI: Doris Evans is a 82 y.o. female  Chief Complaint  Patient presents with  . Hearing Problem    pt's family would like pt's ears cleaned out  . Vaginitis    famiyl states that patient's daughter is saying that the patient is having some discharge   Diabetes Pt with hypoglycemia in the AM.  She gets 10 units of insulin at times but skipped last night.  Blood sugar was 168 this AM.  Poor appetite and not eating well.    Vaginitis See above  Cerumen impaction Needs ears cleaned out.  Using drops of baby oil in ears.    Dementia  Appointment with neurology later this month.  Daughter wants her on a dementia medication   Relevant past medical, surgical, family and social history reviewed and updated as indicated. Interim medical history since our last visit reviewed. Allergies and medications reviewed and updated.  Review of Systems  Per HPI unless specifically indicated above     Objective:    BP 141/89 mmHg  Pulse 88  Temp(Src) 96.2 F (35.7 C)  Ht 4' 9.3" (1.455 m)  Wt 106 lb (48.081 kg)  BMI 22.71 kg/m2  SpO2 96%  LMP  (LMP Unknown)  Wt Readings from Last 3 Encounters:  11/08/15 106 lb (48.081 kg)  11/01/15 106 lb 9.6 oz (48.353 kg)  10/22/15 110 lb (49.896 kg)    Physical Exam  Constitutional: She is oriented to person, place, and time. She appears well-developed and well-nourished. No distress.  HENT:  Head: Normocephalic and atraumatic.  Bilateral cerumen impaction.  Able to irrigate left ear but not right ear.  Hearing is better  Eyes: Conjunctivae and lids are normal. Right eye exhibits no discharge. Left eye exhibits no discharge. No scleral icterus.  Cardiovascular: Normal rate.   Pulmonary/Chest:  Effort normal.  Abdominal: Normal appearance. There is no splenomegaly or hepatomegaly.  Musculoskeletal: Normal range of motion.  Neurological: She is alert and oriented to person, place, and time.  Skin: Skin is warm, dry and intact. No rash noted. No pallor.  Psychiatric: She has a normal mood and affect. Her behavior is normal.    Results for orders placed or performed in visit on 11/01/15  Microscopic Examination  Result Value Ref Range   WBC, UA 6-10 (A) 0 -  5 /hpf   RBC, UA 0-2 0 -  2 /hpf   Epithelial Cells (non renal) 0-10 0 - 10 /hpf   Bacteria, UA Few None seen/Few  Comprehensive metabolic panel  Result Value Ref Range   Glucose 184 (H) 65 - 99 mg/dL   BUN 12 8 - 27 mg/dL   Creatinine, Ser 1.19 0.57 - 1.00 mg/dL   GFR calc non Af Amer 72 >59 mL/min/1.73   GFR calc Af Amer 83 >59 mL/min/1.73   BUN/Creatinine Ratio 16 11 - 26   Sodium 133 (L) 134 - 144 mmol/L   Potassium 4.7 3.5 - 5.2 mmol/L   Chloride 92 (L) 96 - 106 mmol/L   CO2 25 18 - 29 mmol/L   Calcium 9.4 8.7 - 10.3 mg/dL   Total Protein 6.4 6.0 -  8.5 g/dL   Albumin 3.9 3.5 - 4.7 g/dL   Globulin, Total 2.5 1.5 - 4.5 g/dL   Albumin/Globulin Ratio 1.6 1.1 - 2.5   Bilirubin Total 0.9 0.0 - 1.2 mg/dL   Alkaline Phosphatase 57 39 - 117 IU/L   AST 30 0 - 40 IU/L   ALT 35 (H) 0 - 32 IU/L  UA/M w/rflx Culture, Routine  Result Value Ref Range   Specific Gravity, UA 1.015 1.005 - 1.030   pH, UA 7.0 5.0 - 7.5   Color, UA Yellow Yellow   Appearance Ur Cloudy (A) Clear   Leukocytes, UA 2+ (A) Negative   Protein, UA Negative Negative/Trace   Glucose, UA Negative Negative   Ketones, UA Negative Negative   RBC, UA Trace (A) Negative   Bilirubin, UA Negative Negative   Urobilinogen, Ur 1.0 0.2 - 1.0 mg/dL   Nitrite, UA Negative Negative   Microscopic Examination See below:    Urinalysis Reflex Comment   Urine Culture, Routine  Result Value Ref Range   Urine Culture, Routine Final report    Urine Culture result 1  Comment       Assessment & Plan:   Problem List Items Addressed This Visit      Unprioritized   Dementia - Primary    Start Donepezil 5 mg.        Relevant Medications   donepezil (ARICEPT) 5 MG tablet   Excessive cerumen in both ear canals    Other Visit Diagnoses    Vaginal discharge        Cleocin vaginal cream        Follow up plan: Return in about 1 month (around 12/06/2015) for check DM and increase Donepezil.

## 2015-11-08 NOTE — Assessment & Plan Note (Signed)
Stop insulin ass only on 10 u and hypoglycemic

## 2015-11-08 NOTE — Assessment & Plan Note (Signed)
Start Donepezil 5 mg.

## 2015-11-18 ENCOUNTER — Telehealth: Payer: Self-pay | Admitting: Unknown Physician Specialty

## 2015-11-18 NOTE — Telephone Encounter (Signed)
Debbie(pts niece not on hippa) called with husband Jake Shark and stated that they would like to have home health set up. Mr Anfinson can be reached at [phone]

## 2015-11-19 NOTE — Telephone Encounter (Signed)
Is it okay to fill out paper for [facility] and fax to them?

## 2015-11-19 NOTE — Telephone Encounter (Signed)
OK, but this does require a face to face visit.  But, insurance many not pay as there are no skilled nursing needs.  I would see if [facility] could get involved.  She does have DM so she should qualify

## 2015-11-21 ENCOUNTER — Telehealth: Payer: Self-pay | Admitting: Unknown Physician Specialty

## 2015-11-21 NOTE — Telephone Encounter (Signed)
Pts husband called his insurance company and he would like to go through [facility] because they are suppose to be in network and covered.

## 2015-11-21 NOTE — Telephone Encounter (Signed)
Patient's husband came by the office today and said that he contacted the insurance company himself, and their insurance does cover [employer].  Will start on [facility] form and give to [place].

## 2015-11-21 NOTE — Telephone Encounter (Signed)
Keri, I understand that the insurance company may use [facility] as a provider, but they won't pay for home nursing unless there are skilled nursing needs.  This is why I asked if [facility] could be involved with a assessment from nursing and social work.  Is that not possible?

## 2015-11-22 ENCOUNTER — Telehealth: Payer: Self-pay | Admitting: Unknown Physician Specialty

## 2015-11-22 ENCOUNTER — Emergency Department: Payer: Medicare HMO

## 2015-11-22 ENCOUNTER — Encounter: Payer: Self-pay | Admitting: *Deleted

## 2015-11-22 ENCOUNTER — Inpatient Hospital Stay
Admission: EM | Admit: 2015-11-22 | Discharge: 2015-11-24 | DRG: 308 | Disposition: A | Discharge status: Home / Self Care | Payer: Medicare HMO | Attending: Internal Medicine | Admitting: Internal Medicine

## 2015-11-22 DIAGNOSIS — N183 Chronic kidney disease, stage 3 (moderate): Secondary | ICD-10-CM | POA: Diagnosis present

## 2015-11-22 DIAGNOSIS — R000 Tachycardia, unspecified: Principal | ICD-10-CM | POA: Diagnosis present

## 2015-11-22 DIAGNOSIS — E785 Hyperlipidemia, unspecified: Secondary | ICD-10-CM | POA: Diagnosis present

## 2015-11-22 DIAGNOSIS — E1165 Type 2 diabetes mellitus with hyperglycemia: Secondary | ICD-10-CM | POA: Diagnosis present

## 2015-11-22 DIAGNOSIS — Z66 Do not resuscitate: Secondary | ICD-10-CM | POA: Diagnosis present

## 2015-11-22 DIAGNOSIS — Z90710 Acquired absence of both cervix and uterus: Secondary | ICD-10-CM

## 2015-11-22 DIAGNOSIS — Z9049 Acquired absence of other specified parts of digestive tract: Secondary | ICD-10-CM

## 2015-11-22 DIAGNOSIS — Z8249 Family history of ischemic heart disease and other diseases of the circulatory system: Secondary | ICD-10-CM

## 2015-11-22 DIAGNOSIS — Z794 Long term (current) use of insulin: Secondary | ICD-10-CM | POA: Diagnosis not present

## 2015-11-22 DIAGNOSIS — G9341 Metabolic encephalopathy: Secondary | ICD-10-CM | POA: Diagnosis present

## 2015-11-22 DIAGNOSIS — Z7982 Long term (current) use of aspirin: Secondary | ICD-10-CM

## 2015-11-22 DIAGNOSIS — I248 Other forms of acute ischemic heart disease: Secondary | ICD-10-CM | POA: Diagnosis present

## 2015-11-22 DIAGNOSIS — Z79899 Other long term (current) drug therapy: Secondary | ICD-10-CM | POA: Diagnosis not present

## 2015-11-22 DIAGNOSIS — I129 Hypertensive chronic kidney disease with stage 1 through stage 4 chronic kidney disease, or unspecified chronic kidney disease: Secondary | ICD-10-CM | POA: Diagnosis present

## 2015-11-22 DIAGNOSIS — E1122 Type 2 diabetes mellitus with diabetic chronic kidney disease: Secondary | ICD-10-CM | POA: Diagnosis present

## 2015-11-22 DIAGNOSIS — F0390 Unspecified dementia without behavioral disturbance: Secondary | ICD-10-CM | POA: Diagnosis present

## 2015-11-22 DIAGNOSIS — G9340 Encephalopathy, unspecified: Secondary | ICD-10-CM | POA: Diagnosis not present

## 2015-11-22 DIAGNOSIS — M810 Age-related osteoporosis without current pathological fracture: Secondary | ICD-10-CM | POA: Diagnosis present

## 2015-11-22 DIAGNOSIS — I2510 Atherosclerotic heart disease of native coronary artery without angina pectoris: Secondary | ICD-10-CM | POA: Diagnosis present

## 2015-11-22 DIAGNOSIS — I214 Non-ST elevation (NSTEMI) myocardial infarction: Secondary | ICD-10-CM

## 2015-11-22 HISTORY — DX: Unspecified dementia, unspecified severity, without behavioral disturbance, psychotic disturbance, mood disturbance, and anxiety: F03.90

## 2015-11-22 LAB — CBC
HEMATOCRIT: 41.1 % (ref 35.0–47.0)
HEMOGLOBIN: 13.4 g/dL (ref 12.0–16.0)
MCH: 33.2 pg (ref 26.0–34.0)
MCHC: 32.5 g/dL (ref 32.0–36.0)
MCV: 101.9 fL — ABNORMAL HIGH (ref 80.0–100.0)
Platelets: 126 10*3/uL — ABNORMAL LOW (ref 150–440)
RBC: 4.03 MIL/uL (ref 3.80–5.20)
RDW: 26.1 % — ABNORMAL HIGH (ref 11.5–14.5)
WBC: 14.5 10*3/uL — AB (ref 3.6–11.0)

## 2015-11-22 LAB — BASIC METABOLIC PANEL
ANION GAP: 11 (ref 5–15)
BUN: 30 mg/dL — ABNORMAL HIGH (ref 6–20)
CHLORIDE: 99 mmol/L — AB (ref 101–111)
CO2: 24 mmol/L (ref 22–32)
Calcium: 9.9 mg/dL (ref 8.9–10.3)
Creatinine, Ser: 0.96 mg/dL (ref 0.44–1.00)
GFR calc Af Amer: >60 mL/min (ref >60)
GFR, EST NON AFRICAN AMERICAN: 53 mL/min — AB (ref >60)
Glucose, Bld: 415 mg/dL — ABNORMAL HIGH (ref 65–99)
POTASSIUM: 4.2 mmol/L (ref 3.5–5.1)
SODIUM: 134 mmol/L — AB (ref 135–145)

## 2015-11-22 LAB — URINALYSIS COMPLETE WITH MICROSCOPIC (ARMC ONLY)
BACTERIA UA: NONE SEEN
Ketones, ur: NEGATIVE mg/dL
LEUKOCYTES UA: NEGATIVE
NITRITE: NEGATIVE
PH: 5 (ref 5.0–8.0)
SPECIFIC GRAVITY, URINE: 1.025 (ref 1.005–1.030)

## 2015-11-22 LAB — GLUCOSE, CAPILLARY
GLUCOSE-CAPILLARY: 398 mg/dL — AB (ref 65–99)
Glucose-Capillary: 336 mg/dL — ABNORMAL HIGH (ref 65–99)

## 2015-11-22 LAB — TROPONIN I
TROPONIN I: 0.14 ng/mL — AB (ref <0.031)
Troponin I: 8.79 ng/mL — ABNORMAL HIGH (ref <0.031)

## 2015-11-22 LAB — APTT: APTT: 29 s (ref 24–36)

## 2015-11-22 LAB — PROTIME-INR
INR: 1.88
Prothrombin Time: 21.5 seconds — ABNORMAL HIGH (ref 11.4–15.0)

## 2015-11-22 MED ORDER — HEPARIN BOLUS VIA INFUSION
2800.0000 [IU] | Freq: Once | INTRAVENOUS | Status: AC
  Administered 2015-11-22: 2800 [IU] via INTRAVENOUS
  Filled 2015-11-22: qty 2800

## 2015-11-22 MED ORDER — POTASSIUM CHLORIDE CRYS ER 10 MEQ PO TBCR
10.0000 meq | EXTENDED_RELEASE_TABLET | Freq: Every day | ORAL | Status: DC
  Filled 2015-11-22 (×3): qty 1

## 2015-11-22 MED ORDER — ASPIRIN EC 81 MG PO TBEC
81.0000 mg | DELAYED_RELEASE_TABLET | Freq: Every day | ORAL | Status: DC
  Administered 2015-11-24: 81 mg via ORAL
  Filled 2015-11-22: qty 1

## 2015-11-22 MED ORDER — SODIUM CHLORIDE 0.9 % IV BOLUS (SEPSIS)
500.0000 mL | Freq: Once | INTRAVENOUS | Status: AC
  Administered 2015-11-22: 500 mL via INTRAVENOUS

## 2015-11-22 MED ORDER — INSULIN ASPART 100 UNIT/ML ~~LOC~~ SOLN
0.0000 [IU] | Freq: Every day | SUBCUTANEOUS | Status: DC
  Administered 2015-11-22: 4 [IU] via SUBCUTANEOUS
  Administered 2015-11-23: 2 [IU] via SUBCUTANEOUS
  Filled 2015-11-22: qty 2
  Filled 2015-11-22: qty 4

## 2015-11-22 MED ORDER — ASPIRIN 300 MG RE SUPP
300.0000 mg | Freq: Once | RECTAL | Status: AC
  Administered 2015-11-22: 300 mg via RECTAL
  Filled 2015-11-22: qty 1

## 2015-11-22 MED ORDER — SODIUM CHLORIDE 0.9 % IV SOLN
INTRAVENOUS | Status: DC
  Administered 2015-11-22 – 2015-11-24 (×5): via INTRAVENOUS

## 2015-11-22 MED ORDER — INSULIN ASPART 100 UNIT/ML ~~LOC~~ SOLN
0.0000 [IU] | Freq: Three times a day (TID) | SUBCUTANEOUS | Status: DC
  Administered 2015-11-23 (×3): 3 [IU] via SUBCUTANEOUS
  Administered 2015-11-24: 2 [IU] via SUBCUTANEOUS
  Filled 2015-11-22: qty 2
  Filled 2015-11-22 (×3): qty 3

## 2015-11-22 MED ORDER — ONDANSETRON HCL 4 MG PO TABS: 4.0000 mg | ORAL_TABLET | Freq: Four times a day (QID) | ORAL | Status: DC | PRN

## 2015-11-22 MED ORDER — DONEPEZIL HCL 5 MG PO TABS
5.0000 mg | ORAL_TABLET | Freq: Every day | ORAL | Status: DC
  Administered 2015-11-23: 5 mg via ORAL
  Filled 2015-11-22 (×2): qty 1

## 2015-11-22 MED ORDER — LOSARTAN POTASSIUM 50 MG PO TABS
50.0000 mg | ORAL_TABLET | Freq: Every day | ORAL | Status: DC
  Administered 2015-11-24: 50 mg via ORAL
  Filled 2015-11-22 (×2): qty 1

## 2015-11-22 MED ORDER — QUETIAPINE FUMARATE 25 MG PO TABS
50.0000 mg | ORAL_TABLET | Freq: Every day | ORAL | Status: DC
  Administered 2015-11-23: 50 mg via ORAL
  Filled 2015-11-22 (×2): qty 2

## 2015-11-22 MED ORDER — ACETAMINOPHEN 325 MG PO TABS: 650.0000 mg | ORAL_TABLET | Freq: Four times a day (QID) | ORAL | Status: DC | PRN

## 2015-11-22 MED ORDER — HEPARIN (PORCINE) IN NACL 100-0.45 UNIT/ML-% IJ SOLN
650.0000 [IU]/h | INTRAMUSCULAR | Status: DC
  Administered 2015-11-22: 550 [IU]/h via INTRAVENOUS
  Administered 2015-11-24: 650 [IU]/h via INTRAVENOUS
  Filled 2015-11-22 (×3): qty 250

## 2015-11-22 MED ORDER — ASPIRIN 81 MG PO CHEW: 324.0000 mg | CHEWABLE_TABLET | Freq: Once | ORAL | Status: DC

## 2015-11-22 MED ORDER — ATORVASTATIN CALCIUM 20 MG PO TABS
40.0000 mg | ORAL_TABLET | Freq: Every day | ORAL | Status: DC
Start: 2015-11-23 — End: 2015-11-24

## 2015-11-22 MED ORDER — ONDANSETRON HCL 4 MG/2ML IJ SOLN: 4.0000 mg | Freq: Four times a day (QID) | INTRAMUSCULAR | Status: DC | PRN

## 2015-11-22 MED ORDER — SODIUM CHLORIDE 0.9% FLUSH
3.0000 mL | Freq: Two times a day (BID) | INTRAVENOUS | Status: DC
  Administered 2015-11-22 – 2015-11-23 (×2): 3 mL via INTRAVENOUS

## 2015-11-22 MED ORDER — ACETAMINOPHEN 650 MG RE SUPP: 650.0000 mg | Freq: Four times a day (QID) | RECTAL | Status: DC | PRN

## 2015-11-22 MED ORDER — HALOPERIDOL LACTATE 5 MG/ML IJ SOLN
2.0000 mg | Freq: Four times a day (QID) | INTRAMUSCULAR | Status: DC | PRN
  Administered 2015-11-23: 2 mg via INTRAVENOUS
  Filled 2015-11-22: qty 1

## 2015-11-22 MED ORDER — MORPHINE SULFATE (PF) 2 MG/ML IV SOLN
2.0000 mg | INTRAVENOUS | Status: DC | PRN
  Administered 2015-11-22: 2 mg via INTRAVENOUS
  Filled 2015-11-22: qty 1

## 2015-11-22 NOTE — ED Provider Notes (Signed)
[facility] Emergency Department Provider Note  ____________________________________________  Time seen: Approximately 3:52 PM  I have reviewed the triage vital signs and the nursing notes.   HISTORY  Chief Complaint Weakness and Dehydration  Patient's husband provides history, EM caveat, patient is nonverbal  HPI Doris Evans is a 82 y.o. female presents for evaluation of worsening confusion, "hallucinating" and not able to eat anything for over a week.  Husband reports this seems a lot like how she was acting when she was admitted to the hospital recently.  EM caveat: Patient nonverbal and not clearly able to communicate  Past Medical History  Diagnosis Date  . Hypertension   . Other specified forms of tremor   . Osteoporosis   . Diabetes mellitus without complication (HCC)   . Hyperlipidemia   . Constipation   . Hematuria   . Proteinuria   . Age-related cognitive decline   . CAD (coronary artery disease)   . Chronic kidney disease   . Dementia     Patient Active Problem List   Diagnosis Date Noted  . NSTEMI (non-ST elevated myocardial infarction) (HCC) 11/22/2015  . Excessive cerumen in both ear canals 11/08/2015  . Encounter for glucometer instruction 11/01/2015  . Insomnia 11/01/2015  . Hyponatremia 11/01/2015  . Dementia 11/01/2015  . Acute encephalopathy 10/22/2015  . Other specified forms of tremor 08/21/2015  . Senile osteoporosis 08/21/2015  . Hyperlipemia 08/21/2015  . Constipated 08/21/2015  . Hematuria 08/21/2015  . Proteinuria 08/21/2015  . Age-related cognitive decline 08/21/2015  . CAD (coronary artery disease) 08/21/2015  . Benign hypertension with chronic kidney disease 08/21/2015  . Diabetes (HCC) 08/21/2015  . CKD (chronic kidney disease), stage II 08/21/2015    Past Surgical History  Procedure Laterality Date  . Appendectomy    . Thyroid nodule removed  2005  . Abdominal hysterectomy  09/12/14    No current  outpatient prescriptions on file.  Allergies Review of patient's allergies indicates no known allergies.  Family History  Problem Relation Age of Onset  . Hypertension    . Heart attack Father   . Heart attack Sister     Social History Social History  Substance Use Topics  . Smoking status: Never Smoker   . Smokeless tobacco: Never Used  . Alcohol Use: No    Review of Systems  Unable to obtain: patient dementia, also non-verbal and non-communicative ____________________________________________   PHYSICAL EXAM:  VITAL SIGNS: ED Triage Vitals  Enc Vitals Group     BP --      Pulse --      Resp --      Temp --      Temp src --      SpO2 --      Weight --      Height --      Head Cir --      Peak Flow --      Pain Score --      Pain Loc --      Pain Edu? --      Excl. in GC? --    Constitutional: Alert and in no distress but picking at IVs. Eyes: Conjunctivae are normal. PERRL. EOMI. Head: Atraumatic. Nose: No congestion/rhinnorhea. Mouth/Throat: Mucous membranes are moist.  Oropharynx non-erythematous. Neck: No stridor.   Cardiovascular: Normal rate, regular rhythm. Grossly normal heart sounds.  Good peripheral circulation. Respiratory: Normal respiratory effort.  No retractions. Lungs CTAB. Gastrointestinal: Soft and nontender. No distention.  Musculoskeletal:  No lower extremity tenderness nor edema.   Neurologic:  Moves all extremities. In no distress. Difficult to obtain a good focal neurologic exam, though no gross deficit is noted.   ____________________________________________   LABS (all labs ordered are listed, but only abnormal results are displayed)  Labs Reviewed  BASIC METABOLIC PANEL - Abnormal; Notable for the following:    Sodium 134 (*)    Chloride 99 (*)    Glucose, Bld 415 (*)    BUN 30 (*)    GFR calc non Af Amer 53 (*)    All other components within normal limits  CBC - Abnormal; Notable for the following:    WBC 14.5 (*)     MCV 101.9 (*)    RDW 26.1 (*)    Platelets 126 (*)    All other components within normal limits  URINALYSIS COMPLETEWITH MICROSCOPIC ([facility] ONLY) - Abnormal; Notable for the following:    Color, Urine AMBER (*)    APPearance CLEAR (*)    Glucose, UA >500 (*)    Bilirubin Urine 1+ (*)    Hgb urine dipstick 1+ (*)    Protein, ur >500 (*)    Squamous Epithelial / LPF 0-5 (*)    All other components within normal limits  GLUCOSE, CAPILLARY - Abnormal; Notable for the following:    Glucose-Capillary 398 (*)    All other components within normal limits  TROPONIN I - Abnormal; Notable for the following:    Troponin I 8.79 (*)    All other components within normal limits  PROTIME-INR - Abnormal; Notable for the following:    Prothrombin Time 21.5 (*)    All other components within normal limits  TROPONIN I - Abnormal; Notable for the following:    Troponin I 0.14 (*)    All other components within normal limits  GLUCOSE, CAPILLARY - Abnormal; Notable for the following:    Glucose-Capillary 336 (*)    All other components within normal limits  CULTURE, BLOOD (ROUTINE X 2)  CULTURE, BLOOD (ROUTINE X 2)  APTT  HEPARIN LEVEL (UNFRACTIONATED)  BASIC METABOLIC PANEL  CBC  TROPONIN I  TROPONIN I  CBG MONITORING, ED   ____________________________________________  EKG  Reviewed and interpreted by me at 1725 Heart rate 90 QTC 500 QRS 1:30 Left bundle branch block, compared with previous EKG from 10/22/2015 no obvious new ischemic changes noted. There is some increased depth of inversions in lateral leads and inferior. EKG reviewed with Dr. Gwen Pounds at 5:30 PM. Based on the patient's elevated troponin at this time he also recommends heparin infusion. Patient's family updated who report that they noticed that they thought her mother was in pain about 2 days ago, and obtaining additional history from patient's daughter and niece I question if it's possible she may have had an acute cardiac  event over 48 hours ago. ____________________________________________  RADIOLOGY  DG Chest Portable 1 View (Final result) Result time: 11/22/15 16:17:39   Final result by Rad Results In Interface (11/22/15 16:17:39)   Narrative:   CLINICAL DATA: Weakness and fatigue, history of dementia, decreased oral intake history of diabetes and coronary artery disease.  EXAM: PORTABLE CHEST 1 VIEW  COMPARISON: PA and lateral chest x-ray of October 25, 2015.  FINDINGS: The lungs are hypoinflated. Increased density at both lung bases is consistent with atelectasis or early pneumonia. There is no definite pleural effusion. The cardiac silhouette remains enlarged but is stable. There is mild stable central pulmonary vascular prominence. The interstitial  markings are coarse but stable. The bones are osteopenic.  IMPRESSION: The study is limited due to hypoinflation. Bibasilar atelectasis or early pneumonia is suspected. There is chronic low-grade compensated CHF.   Electronically Signed By: David Swaziland M.D. On: 11/22/2015 16:17          CT Head Wo Contrast (Final result) Result time: 11/22/15 16:10:18   Final result by Rad Results In Interface (11/22/15 16:10:18)   Narrative:   CLINICAL DATA: And dementia with hallucinations.  EXAM: CT HEAD WITHOUT CONTRAST  TECHNIQUE: Contiguous axial images were obtained from the base of the skull through the vertex without intravenous contrast.  COMPARISON: 10/22/2015  FINDINGS: There is no evidence for acute hemorrhage, hydrocephalus, mass lesion, or abnormal extra-axial fluid collection. No definite CT evidence for acute infarction. Diffuse loss of parenchymal volume is consistent with atrophy. Patchy low attenuation in the deep hemispheric and periventricular white matter is nonspecific, but likely reflects chronic microvascular ischemic demyelination. The visualized paranasal sinuses and mastoid air cells are  clear.  IMPRESSION: Stable. No acute intracranial abnormality.  Atrophy with chronic small vessel white matter ischemic disease.   Electronically Signed By: Kennith Center M.D. On: 11/22/2015 16:10       ____________________________________________   PROCEDURES  Procedure(s) performed: None  Critical Care performed: Yes, see critical care note(s)  CRITICAL CARE Performed by: Sharyn Creamer   Total critical care time: 40 minutes  Critical care time was exclusive of separately billable procedures and treating other patients.  Critical care was necessary to treat or prevent imminent or life-threatening deterioration.  Critical care was time spent personally by me on the following activities: development of treatment plan with patient and/or surrogate as well as nursing, discussions with consultants, evaluation of patient's response to treatment, examination of patient, obtaining history from patient or surrogate, ordering and performing treatments and interventions, ordering and review of laboratory studies, ordering and review of radiographic studies, pulse oximetry and re-evaluation of patient's condition.  ____________________________________________   INITIAL IMPRESSION / ASSESSMENT AND PLAN / ED COURSE  Pertinent labs & imaging results that were available during my care of the patient were reviewed by me and considered in my medical decision making (see chart for details).  Patient transfer worsening confusion, alteration of mental status. CT of the head is negative for bleed, hemoglobin stable, but EKG demonstrates questionable new ischemic pattern without ST elevation in inferior leads in the setting of a known left bundle. In addition, discussed with Dr. Gwen Pounds who advises heparin and aspirin which have been ordered. I suspect in gathering history and the patient may have had acute coronary type of event greater than 48 hours ago.   Cannot exclude pneumonia,  however already defer to the hospitalist for ongoing management. Patient does have elevated white count which may go along with acute coronary syndrome as well. She is afebrile at this time and not coughing. ____________________________________________   FINAL CLINICAL IMPRESSION(S) / ED DIAGNOSES  Final diagnoses:  NSTEMI (non-ST elevated myocardial infarction) (HCC)  Encephalopathy      Sharyn Creamer, MD 11/22/15 2207

## 2015-11-22 NOTE — Telephone Encounter (Signed)
pts granddaughter called and stated that the pts blood sugar was over 500 last night and 380 at 1pm today. She also stated that the pt hadn't been eating or drinking much for 2 or 3 days. Doris Evans called to let us know that Mr Harold(pts husband) was going to take her to the ED.

## 2015-11-22 NOTE — ED Notes (Addendum)
States she was in the hospital 3 weeks ago for a UTI, now family states pt is not eating, and they believes she is dehydrated, pt has hx of severe dementia, family states hx of DM and CBG was 506 last night

## 2015-11-22 NOTE — ED Notes (Addendum)
Patient husband states that patient has a hx/o dementia and over the past month she has been declining. Patient is not eating as much and is not drinking much fluids. Patients husband states that patient has been less active today and has been hallucinating.

## 2015-11-22 NOTE — Progress Notes (Signed)
ANTICOAGULATION CONSULT NOTE - Initial Consult  Pharmacy Consult for Heparin drip  Indication: chest pain/ACS  No Known Allergies  Patient Measurements:   Heparin Dosing Weight: 105 lbs (47kg)   Vital Signs: BP: 140/83 mmHg (02/24 1830) Pulse Rate: 90 (02/24 1830)   Recent Labs  11/22/15 1318 11/22/15 1759  HGB 13.4  --   HCT 41.1  --   PLT 126*  --   APTT  --  29  LABPROT  --  21.5*  INR  --  1.88  CREATININE 0.96  --   TROPONINI 8.79*  --     CrCl cannot be calculated (Unknown ideal weight.).   Medical History: Past Medical History  Diagnosis Date  . Hypertension   . Other specified forms of tremor   . Osteoporosis   . Diabetes mellitus without complication (HCC)   . Hyperlipidemia   . Constipation   . Hematuria   . Proteinuria   . Age-related cognitive decline   . CAD (coronary artery disease)   . Chronic kidney disease   . Dementia     Assessment: 82 yo patient in ED with weakness, worsening confusion, and dehydration. Troponins elevated at 8.79. Pharmacy consulted for heparin dosing and monitoring for ACS.  Patients weight was called in by ED nurse at 105 lbs (47kg).    Goal of Therapy:  Heparin level 0.3-0.7 units/ml Monitor platelets by anticoagulation protocol: Yes   Plan:  Give 2800 units bolus x 1 Start heparin infusion at 560 units/hr Check anti-Xa level in 8 hours and daily while on heparin Continue to monitor H&H and platelets   Patient high risk of bleeding based on platelets.Continue to monitor platelet trends.   Cher Nakai, PharmD Pharmacy Resident  11/22/2015,6:38 PM

## 2015-11-22 NOTE — Telephone Encounter (Signed)
Routing to provider. FYI.  

## 2015-11-22 NOTE — Telephone Encounter (Signed)
Thank you :)

## 2015-11-22 NOTE — H&P (Signed)
[facility] - [place] [facility]   PATIENT NAME: Doris Evans    MR#:  [id number]  DATE OF BIRTH:  1929-03-31  DATE OF ADMISSION:  11/22/2015  PRIMARY CARE PHYSICIAN: [name], NP   REQUESTING/REFERRING PHYSICIAN: Dr. Sharyn Creamer  CHIEF COMPLAINT:   Chief Complaint  Patient presents with  . Weakness  . Dehydration    HISTORY OF PRESENT ILLNESS:  Dajsha Massaro  is a 82 y.o. female with a known history of advanced dementia, hypertension, osteoporosis, adult failure to thrive, hyperlipidemia, cognitive decline, chronic kidney disease stage III, who presents to the hospital but by the family due to her extremely poor by mouth intake, lethargy, uncontrolled blood sugars and patient not being able to sleep for days. On routine blood work in the emergency room patient was noted to have elevated troponin of over 8 and ruled in for a NSTEMI.  Patient clinically cannot give any focal symptoms given her advanced dementia. Per the family patient has been a bit more comfortable-looking like she is in pain but cannot describe very days. She does not appear to be short of breath or any acute respiratory distress presently. Given her elevated troponin and uncontrolled blood sugars hospitalist services were contacted further treatment and evaluation.  PAST MEDICAL HISTORY:   Past Medical History  Diagnosis Date  . Hypertension   . Other specified forms of tremor   . Osteoporosis   . Diabetes mellitus without complication (HCC)   . Hyperlipidemia   . Constipation   . Hematuria   . Proteinuria   . Age-related cognitive decline   . CAD (coronary artery disease)   . Chronic kidney disease   . Dementia     PAST SURGICAL HISTORY:   Past Surgical History  Procedure Laterality Date  . Appendectomy    . Thyroid nodule removed  2005  . Abdominal hysterectomy  09/12/14    SOCIAL HISTORY:   Social History  Substance Use Topics  . Smoking status: Never Smoker   .  Smokeless tobacco: Never Used  . Alcohol Use: No    FAMILY HISTORY:   Family History  Problem Relation Age of Onset  . Hypertension    . Heart attack Father   . Heart attack Sister     DRUG ALLERGIES:  No Known Allergies  REVIEW OF SYSTEMS:   Review of Systems  Unable to perform ROS: dementia    MEDICATIONS AT HOME:   Prior to Admission medications   Medication Sig Start Date End Date Taking? Authorizing Provider  aspirin EC 81 MG tablet Take 81 mg by mouth daily.   Yes Historical Provider, MD  donepezil (ARICEPT) 5 MG tablet Take 1 tablet (5 mg total) by mouth at bedtime. 11/08/15  Yes [name], NP  furosemide (LASIX) 20 MG tablet Take 1 tablet (20 mg total) by mouth daily. 10/27/15  Yes Srikar Sudini, MD  losartan (COZAAR) 50 MG tablet Take 1 tablet (50 mg total) by mouth daily. 08/21/15  Yes [name], NP  potassium chloride (K-DUR) 10 MEQ tablet Take 1 tablet (10 mEq total) by mouth daily. 10/27/15  Yes Srikar Sudini, MD  clindamycin (CLEOCIN) 2 % vaginal cream Place 1 Applicatorful vaginally at bedtime. Patient not taking: Reported on 11/22/2015 11/08/15   [name], NP  insulin detemir (LEVEMIR) 100 UNIT/ML injection Inject 0.1 mLs (10 Units total) into the skin at bedtime. Patient not taking: Reported on 11/22/2015 10/27/15   Milagros Loll, MD  VITAL SIGNS:  Blood pressure 140/83, pulse 90, resp. rate 28, SpO2 100 %.  PHYSICAL EXAMINATION:  Physical Exam  GENERAL:  82 y.o.-year-old patient lying in the bed in mild distress/agitated.  EYES: Pupils equal, round, reactive to light and accommodation. No scleral icterus. Extraocular muscles intact.  HEENT: Head atraumatic, normocephalic. Oropharynx and nasopharynx clear. No oropharyngeal erythema, moist oral mucosa  NECK:  Supple, no jugular venous distention. No thyroid enlargement, no tenderness.  LUNGS: Normal breath sounds bilaterally, no wheezing, rales, rhonchi. No use of accessory muscles of  respiration.  CARDIOVASCULAR: S1, S2 RRR. No murmurs, rubs, gallops, clicks.  ABDOMEN: Soft, nontender, nondistended. Bowel sounds present. No organomegaly or mass.  EXTREMITIES: No pedal edema, cyanosis, or clubbing. + 2 pedal & radial pulses b/l.   NEUROLOGIC: Difficult to do a full neurological exam given the patient's dementia. Patient moves all extremities spontaneously. No focal deficits noted.  PSYCHIATRIC: The patient is alert and oriented x 1. Agitated.  SKIN: No obvious rash, lesion, or ulcer.   LABORATORY PANEL:   CBC  Recent Labs Lab 11/22/15 1318  WBC 14.5*  HGB 13.4  HCT 41.1  PLT 126*   ------------------------------------------------------------------------------------------------------------------  Chemistries   Recent Labs Lab 11/22/15 1318  NA 134*  K 4.2  CL 99*  CO2 24  GLUCOSE 415*  BUN 30*  CREATININE 0.96  CALCIUM 9.9   ------------------------------------------------------------------------------------------------------------------  Cardiac Enzymes  Recent Labs Lab 11/22/15 1318  TROPONINI 8.79*   ------------------------------------------------------------------------------------------------------------------  RADIOLOGY:  Ct Head Wo Contrast  11/22/2015  CLINICAL DATA:  And dementia with hallucinations. EXAM: CT HEAD WITHOUT CONTRAST TECHNIQUE: Contiguous axial images were obtained from the base of the skull through the vertex without intravenous contrast. COMPARISON:  10/22/2015 FINDINGS: There is no evidence for acute hemorrhage, hydrocephalus, mass lesion, or abnormal extra-axial fluid collection. No definite CT evidence for acute infarction. Diffuse loss of parenchymal volume is consistent with atrophy. Patchy low attenuation in the deep hemispheric and periventricular white matter is nonspecific, but likely reflects chronic microvascular ischemic demyelination. The visualized paranasal sinuses and mastoid air cells are clear. IMPRESSION:  Stable.  No acute intracranial abnormality. Atrophy with chronic small vessel white matter ischemic disease. Electronically Signed   By: Kennith Center M.D.   On: 11/22/2015 16:10   Dg Chest Portable 1 View  11/22/2015  CLINICAL DATA:  Weakness and fatigue, history of dementia, decreased oral intake history of diabetes and coronary artery disease. EXAM: PORTABLE CHEST 1 VIEW COMPARISON:  PA and lateral chest x-ray of October 25, 2015. FINDINGS: The lungs are hypoinflated. Increased density at both lung bases is consistent with atelectasis or early pneumonia. There is no definite pleural effusion. The cardiac silhouette remains enlarged but is stable. There is mild stable central pulmonary vascular prominence. The interstitial markings are coarse but stable. The bones are osteopenic. IMPRESSION: The study is limited due to hypoinflation. Bibasilar atelectasis or early pneumonia is suspected. There is chronic low-grade compensated CHF. Electronically Signed   By: David  Swaziland M.D.   On: 11/22/2015 16:17     IMPRESSION AND PLAN:   82 year old female with past medical history of advanced dementia, cognitive decline, hypertension, diabetes, who presents to the hospital due to poor by mouth intake, weakness, hallucinations at night and difficulty sleeping and noted to have an elevated troponin.  #1 non-ST elevation MI-patient comes in with cardiac markers given elevated troponin at 8. She clinically has no acute chest pain and no acute EKG changes presently. -Observe on telemetry, cycle her  cardiac markers. Given her advanced age and dementia likely candidate for medical management. -Continue aspirin, heparin nomogram, atorvastatin, morphine.   -I will get a cardiology consult, check two-dimensional echocardiogram.  #2 advanced dementia-patient has been having significant hallucinations, failure to thrive, poor by mouth intake which has progressively gotten worse over the past few weeks. -I will start her  on some Seroquel at bedtime. Use Haldol as needed for agitation. Avoid benzodiazepines. -Place her on some IV fluids. Get a case management/social work consult to discuss possible hospice screening and discussed this with the patient's family. - cont. Aricept.   #3 HTN - cont. Losartan.   #4 Hyperglycemia - due to uncontrolled DM.  - place on SSI and follow BS.    All the records are reviewed and case discussed with ED provider. Management plans discussed with the patient, family and they are in agreement.  CODE STATUS: DO NOT RESUSCITATE  TOTAL TIME TAKING CARE OF THIS PATIENT: 50 minutes.    Houston Siren M.D on 11/22/2015 at 6:53 PM  Between 7am to 6pm - Pager - [phone]  After 6pm go to [email or web address] - password [email or web address] [facility]  [facility] [facility]  Office  [phone]  CC: Primary care physician; [name], NP

## 2015-11-23 ENCOUNTER — Inpatient Hospital Stay
Admit: 2015-11-23 | Discharge: 2015-11-23 | Disposition: A | Discharge status: Home / Self Care | Payer: Medicare HMO | Attending: Specialist | Admitting: Specialist

## 2015-11-23 LAB — CBC
HEMATOCRIT: 43.6 % (ref 35.0–47.0)
HEMOGLOBIN: 14.1 g/dL (ref 12.0–16.0)
MCH: 33 pg (ref 26.0–34.0)
MCHC: 32.3 g/dL (ref 32.0–36.0)
MCV: 102.1 fL — ABNORMAL HIGH (ref 80.0–100.0)
Platelets: 107 10*3/uL — ABNORMAL LOW (ref 150–440)
RBC: 4.27 MIL/uL (ref 3.80–5.20)
RDW: 25.9 % — ABNORMAL HIGH (ref 11.5–14.5)
WBC: 20.7 10*3/uL — AB (ref 3.6–11.0)

## 2015-11-23 LAB — GLUCOSE, CAPILLARY
GLUCOSE-CAPILLARY: 206 mg/dL — AB (ref 65–99)
GLUCOSE-CAPILLARY: 223 mg/dL — AB (ref 65–99)
Glucose-Capillary: 204 mg/dL — ABNORMAL HIGH (ref 65–99)
Glucose-Capillary: 249 mg/dL — ABNORMAL HIGH (ref 65–99)

## 2015-11-23 LAB — TROPONIN I
TROPONIN I: 0.13 ng/mL — AB (ref <0.031)
TROPONIN I: 0.15 ng/mL — AB (ref <0.031)

## 2015-11-23 LAB — BASIC METABOLIC PANEL
ANION GAP: 10 (ref 5–15)
BUN: 27 mg/dL — ABNORMAL HIGH (ref 6–20)
CALCIUM: 9.4 mg/dL (ref 8.9–10.3)
CO2: 22 mmol/L (ref 22–32)
Chloride: 102 mmol/L (ref 101–111)
Creatinine, Ser: 1.01 mg/dL — ABNORMAL HIGH (ref 0.44–1.00)
GFR, EST AFRICAN AMERICAN: 58 mL/min — AB (ref >60)
GFR, EST NON AFRICAN AMERICAN: 50 mL/min — AB (ref >60)
GLUCOSE: 212 mg/dL — AB (ref 65–99)
POTASSIUM: 4 mmol/L (ref 3.5–5.1)
SODIUM: 134 mmol/L — AB (ref 135–145)

## 2015-11-23 LAB — HEPARIN LEVEL (UNFRACTIONATED)
HEPARIN UNFRACTIONATED: 0.34 [IU]/mL (ref 0.30–0.70)
Heparin Unfractionated: 0.23 [IU]/mL — ABNORMAL LOW (ref 0.30–0.70)

## 2015-11-23 MED ORDER — AMIODARONE LOAD VIA INFUSION
150.0000 mg | Freq: Once | INTRAVENOUS | Status: AC
  Administered 2015-11-23: 150 mg via INTRAVENOUS
  Filled 2015-11-23: qty 83.34

## 2015-11-23 MED ORDER — FLUCONAZOLE IN SODIUM CHLORIDE 100-0.9 MG/50ML-% IV SOLN: 100.0000 mg | INTRAVENOUS | Status: DC

## 2015-11-23 MED ORDER — NYSTATIN 100000 UNIT/ML MT SUSP
5.0000 mL | Freq: Four times a day (QID) | OROMUCOSAL | Status: DC
  Administered 2015-11-23 – 2015-11-24 (×2): 500000 [IU] via ORAL
  Filled 2015-11-23 (×7): qty 5

## 2015-11-23 MED ORDER — HEPARIN BOLUS VIA INFUSION
700.0000 [IU] | Freq: Once | INTRAVENOUS | Status: AC
  Administered 2015-11-23: 700 [IU] via INTRAVENOUS
  Filled 2015-11-23: qty 700

## 2015-11-23 MED ORDER — DILTIAZEM HCL 25 MG/5ML IV SOLN
10.0000 mg | INTRAVENOUS | Status: AC
  Administered 2015-11-23: 10 mg via INTRAVENOUS

## 2015-11-23 MED ORDER — AMIODARONE HCL IN DEXTROSE 360-4.14 MG/200ML-% IV SOLN
60.0000 mg/h | INTRAVENOUS | Status: DC
  Administered 2015-11-23: 60 mg/h via INTRAVENOUS
  Filled 2015-11-23: qty 200

## 2015-11-23 MED ORDER — AMIODARONE HCL 200 MG PO TABS
400.0000 mg | ORAL_TABLET | Freq: Two times a day (BID) | ORAL | Status: DC
  Administered 2015-11-23 – 2015-11-24 (×2): 400 mg via ORAL
  Filled 2015-11-23 (×2): qty 2

## 2015-11-23 MED ORDER — AMIODARONE HCL IN DEXTROSE 360-4.14 MG/200ML-% IV SOLN
30.0000 mg/h | INTRAVENOUS | Status: DC
Start: 2015-11-23 — End: 2015-11-23
  Filled 2015-11-23 (×3): qty 200

## 2015-11-23 NOTE — Progress Notes (Signed)
ANTICOAGULATION CONSULT NOTE - Initial Consult  Pharmacy Consult for Heparin drip  Indication: chest pain/ACS  No Known Allergies  Patient Measurements: Weight: 100 lb 8 oz (45.587 kg) Heparin Dosing Weight: 105 lbs (47kg)   Vital Signs: Temp: 98.1 F (36.7 C) (02/25 0600) Temp Source: Oral (02/25 0600) BP: 92/67 mmHg (02/25 1143) Pulse Rate: 113 (02/25 1055)   Recent Labs  11/22/15 1318 11/22/15 1759 11/22/15 2115 11/23/15 0033 11/23/15 0400 11/23/15 0537 11/23/15 1300  HGB 13.4  --   --   --   --  14.1  --   HCT 41.1  --   --   --   --  43.6  --   PLT 126*  --   --   --   --  107*  --   APTT  --  29  --   --   --   --   --   LABPROT  --  21.5*  --   --   --   --   --   INR  --  1.88  --   --   --   --   --   HEPARINUNFRC  --   --   --   --  0.23*  --  0.34  CREATININE 0.96  --   --   --   --  1.01*  --   TROPONINI 8.79*  --  0.14* 0.13*  --  0.15*  --     Estimated Creatinine Clearance: 25.7 mL/min (by C-G formula based on Cr of 1.01).   Medical History: Past Medical History  Diagnosis Date  . Hypertension   . Other specified forms of tremor   . Osteoporosis   . Diabetes mellitus without complication (HCC)   . Hyperlipidemia   . Constipation   . Hematuria   . Proteinuria   . Age-related cognitive decline   . CAD (coronary artery disease)   . Chronic kidney disease   . Dementia     Assessment: 82 yo patient in ED with weakness, worsening confusion, and dehydration. Troponins elevated at 8.79. Pharmacy consulted for heparin dosing and monitoring for ACS.  Patients weight was called in by ED nurse at 105 lbs (47kg).    Goal of Therapy:  Heparin level 0.3-0.7 units/ml Monitor platelets by anticoagulation protocol: Yes   Plan:  Give 2800 units bolus x 1 Start heparin infusion at 560 units/hr Check anti-Xa level in 8 hours and daily while on heparin Continue to monitor H&H and platelets   Patient high risk of bleeding based on platelets.Continue  to monitor platelet trends.   2/25 AM heparin level 0.23. 700 unit bolus and increase rate to 650 units/hr. Recheck in 8 hours.  2/25 1300 HL 0.34. Therapeutic. Continue current regimen and recheck in 8 hours.  Roque Cash, PharmD Pharmacy Resident 11/23/2015

## 2015-11-23 NOTE — Consult Note (Signed)
[facility] Cardiology Consultation Note  Patient ID: Doris Evans, MRN: [id number], DOB/AGE: Jul 08, 1929 82 y.o. Admit date: 11/22/2015   Date of Consult: 11/23/2015 Primary Physician: [name], NP Primary Cardiologist: None  Chief Complaint:  Chief Complaint  Patient presents with  . Weakness  . Dehydration   Reason for Consult: elevated troponin  HPI: 82 y.o. female with severe dementia diabetes chronic kidney disease stage III essential hypertension and an abnormal EKG for which the patient is failure to thrive and has had worsening dementia in the last several days. The patient when arriving to the emergency room has had an EKG showing normal sinus rhythm with possible inferior and septal myocardial infarction age undetermined. This is not significantly changed from before. There was reports of elevated troponin although currently troponin by lab work I have seen is 0.15 and not significantly elevated. There is no current evidence of significant myocardial infarction. The patient is hemodynamically stable at this time and cannot converse with [place] at this time to assess for possible myocardial infarction  Past Medical History  Diagnosis Date  . Hypertension   . Other specified forms of tremor   . Osteoporosis   . Diabetes mellitus without complication (HCC)   . Hyperlipidemia   . Constipation   . Hematuria   . Proteinuria   . Age-related cognitive decline   . CAD (coronary artery disease)   . Chronic kidney disease   . Dementia       Surgical History:  Past Surgical History  Procedure Laterality Date  . Appendectomy    . Thyroid nodule removed  2005  . Abdominal hysterectomy  09/12/14     Home Meds: Prior to Admission medications   Medication Sig Start Date End Date Taking? Authorizing Provider  aspirin EC 81 MG tablet Take 81 mg by mouth daily.   Yes Historical Provider, MD  donepezil (ARICEPT) 5 MG tablet Take 1 tablet (5 mg total) by mouth at bedtime. 11/08/15   Yes [name], NP  furosemide (LASIX) 20 MG tablet Take 1 tablet (20 mg total) by mouth daily. 10/27/15  Yes Srikar Sudini, MD  losartan (COZAAR) 50 MG tablet Take 1 tablet (50 mg total) by mouth daily. 08/21/15  Yes [name], NP  potassium chloride (K-DUR) 10 MEQ tablet Take 1 tablet (10 mEq total) by mouth daily. 10/27/15  Yes Srikar Sudini, MD  clindamycin (CLEOCIN) 2 % vaginal cream Place 1 Applicatorful vaginally at bedtime. Patient not taking: Reported on 11/22/2015 11/08/15   [name], NP  insulin detemir (LEVEMIR) 100 UNIT/ML injection Inject 0.1 mLs (10 Units total) into the skin at bedtime. Patient not taking: Reported on 11/22/2015 10/27/15   Milagros Loll, MD    Inpatient Medications:  . aspirin EC  81 mg Oral Daily  . atorvastatin  40 mg Oral q1800  . donepezil  5 mg Oral QHS  . insulin aspart  0-5 Units Subcutaneous QHS  . insulin aspart  0-9 Units Subcutaneous TID WC  . losartan  50 mg Oral Daily  . potassium chloride  10 mEq Oral Daily  . QUEtiapine  50 mg Oral QHS  . sodium chloride flush  3 mL Intravenous Q12H   . sodium chloride 75 mL/hr at 11/22/15 2202  . amiodarone 60 mg/hr (11/23/15 0903)   Followed by  . amiodarone    . heparin 650 Units/hr (11/23/15 4098)    Allergies: No Known Allergies  Social History   Social History  . Marital Status: Married  Spouse Name: N/A  . Number of Children: N/A  . Years of Education: N/A   Occupational History  . Not on file.   Social History Main Topics  . Smoking status: Never Smoker   . Smokeless tobacco: Never Used  . Alcohol Use: No  . Drug Use: No  . Sexual Activity: Not on file   Other Topics Concern  . Not on file   Social History Narrative     Family History  Problem Relation Age of Onset  . Hypertension    . Heart attack Father   . Heart attack Sister      Review of Systems Positive for unable to assess due to dementia    Labs:  Recent Labs  11/22/15 1318 11/22/15 2115  11/23/15 0033 11/23/15 0537  TROPONINI 8.79* 0.14* 0.13* 0.15*   Lab Results  Component Value Date   WBC 20.7* 11/23/2015   HGB 14.1 11/23/2015   HCT 43.6 11/23/2015   MCV 102.1* 11/23/2015   PLT 107* 11/23/2015    Recent Labs Lab 11/23/15 0537  NA 134*  K 4.0  CL 102  CO2 22  BUN 27*  CREATININE 1.01*  CALCIUM 9.4  GLUCOSE 212*   No results found for: CHOL, HDL, LDLCALC, TRIG No results found for: DDIMER  Radiology/Studies:  Dg Chest 2 View  10/25/2015  CLINICAL DATA:  Wheezing. EXAM: CHEST  2 VIEW COMPARISON:  September 12, 2014. FINDINGS: Stable cardiomediastinal silhouette. Mildly increased interstitial densities are noted throughout both lungs concerning for pulmonary edema. Mild bilateral pleural effusions are noted. Atherosclerosis of descending thoracic aorta is noted. Bony thorax is unremarkable. IMPRESSION: Probable mild diffuse pulmonary edema with mild bilateral pleural effusions. Electronically Signed   By: Lupita Raider, M.D.   On: 10/25/2015 16:25   Ct Head Wo Contrast  11/22/2015  CLINICAL DATA:  And dementia with hallucinations. EXAM: CT HEAD WITHOUT CONTRAST TECHNIQUE: Contiguous axial images were obtained from the base of the skull through the vertex without intravenous contrast. COMPARISON:  10/22/2015 FINDINGS: There is no evidence for acute hemorrhage, hydrocephalus, mass lesion, or abnormal extra-axial fluid collection. No definite CT evidence for acute infarction. Diffuse loss of parenchymal volume is consistent with atrophy. Patchy low attenuation in the deep hemispheric and periventricular white matter is nonspecific, but likely reflects chronic microvascular ischemic demyelination. The visualized paranasal sinuses and mastoid air cells are clear. IMPRESSION: Stable.  No acute intracranial abnormality. Atrophy with chronic small vessel white matter ischemic disease. Electronically Signed   By: Kennith Center M.D.   On: 11/22/2015 16:10   Dg Chest Portable  1 View  11/22/2015  CLINICAL DATA:  Weakness and fatigue, history of dementia, decreased oral intake history of diabetes and coronary artery disease. EXAM: PORTABLE CHEST 1 VIEW COMPARISON:  PA and lateral chest x-ray of October 25, 2015. FINDINGS: The lungs are hypoinflated. Increased density at both lung bases is consistent with atelectasis or early pneumonia. There is no definite pleural effusion. The cardiac silhouette remains enlarged but is stable. There is mild stable central pulmonary vascular prominence. The interstitial markings are coarse but stable. The bones are osteopenic. IMPRESSION: The study is limited due to hypoinflation. Bibasilar atelectasis or early pneumonia is suspected. There is chronic low-grade compensated CHF. Electronically Signed   By: David  [place] M.D.   On: 11/22/2015 16:17    EKG: Normal sinus rhythm with inferior and septal myocardial infarction age undetermined  Weights: Filed Weights   11/22/15 2053  Weight: 100 lb 8  oz (45.587 kg)     Physical Exam: Blood pressure 80/53, pulse 61, temperature 98.1 F (36.7 C), temperature source Oral, resp. rate 16, weight 100 lb 8 oz (45.587 kg), SpO2 96 %. Body mass index is 21.53 kg/(m^2). General: Well developed, poorly nourished, in no acute distress. Head eyes ears nose throat: Normocephalic, atraumatic, sclera non-icteric, no xanthomas, nares are without discharge. No apparent thyromegaly and/or mass  Lungs: Normal respiratory effort.  no wheezes, no rales, no rhonchi.  Heart: RRR with normal S1 S2. no murmur gallop, no rub, PMI is normal size and placement, carotid upstroke normal without bruit, jugular venous pressure is normal Abdomen: Soft, non-tender, non-distended with normoactive bowel sounds. No hepatomegaly. No rebound/guarding. No obvious abdominal masses. Abdominal aorta is normal size without bruit Extremities: No edema. no cyanosis, no clubbing, no ulcers  Peripheral : 2+ bilateral upper extremity  pulses, 2+ bilateral femoral pulses, 2+ bilateral dorsal pedal pulse Neuro: Not Alert and oriented. No facial asymmetry. No focal deficit. Moves all extremities spontaneously. Musculoskeletal: Normal muscle tone without kyphosis.    Assessment: 82 year old female with history of chronic kidney disease diabetes essential hypertension coronary atherosclerosis and elevated troponin but no current evidence of non-ST elevation myocardial infarction  Plan: 1. Patient can have discontinuation of heparin at this time due to no apparent non-ST elevation myocardial infarction 2. Continue hypertension control off for further risk reduction cardiovascular disease 3. High intensity cholesterol therapy with atorvastatin 4. No further cardiac diagnostics at this time due to patient having no apparent symptoms and no current evidence of significant elevation of troponin 5. Further investigation of need for amiodarone  Signed, Lamar Blinks M.D. [facility] Cardiology 11/23/2015, 9:20 AM

## 2015-11-23 NOTE — Progress Notes (Signed)
Pt has not urinated. Bladder scanned x3, last scan  proves 251 in bladder. Md notified. Orders to in and out cath patient. I will continue to assess.

## 2015-11-23 NOTE — Plan of Care (Signed)
Problem: Education: Goal: Knowledge of [place] [facility] Education information/materials will improve Outcome: Not Progressing Patient has dementia and unable to comprehend anything at this time.

## 2015-11-23 NOTE — Progress Notes (Signed)
Per Kendal Hymen (patient's daughter ) patient is Jehovah Witness and absolutely no blood transfusion. The  RN indicated the information will be passed on.

## 2015-11-23 NOTE — Progress Notes (Signed)
Skin assessment done with Diannia Ruder RN, noted scattered  Bruises and redness on the sacrum. Prophylactic sacral foam applied. Also noted +1 pitting edemaon  bilateral ankle.

## 2015-11-23 NOTE — Progress Notes (Signed)
Md notified. Pts HR is substaining in 170s. MD orders  IV cardizem. I will continue to assess.

## 2015-11-23 NOTE — Plan of Care (Signed)
Problem: Pain Managment: Goal: General experience of comfort will improve Outcome: Progressing No s/s of pain since admitted.

## 2015-11-23 NOTE — Progress Notes (Signed)
ANTICOAGULATION CONSULT NOTE - Initial Consult  Pharmacy Consult for Heparin drip  Indication: chest pain/ACS  No Known Allergies  Patient Measurements: Weight: 100 lb 8 oz (45.587 kg) Heparin Dosing Weight: 105 lbs (47kg)   Vital Signs: Temp: 97.8 F (36.6 C) (02/24 2053) Temp Source: Oral (02/24 2053) BP: 138/95 mmHg (02/24 2053) Pulse Rate: 58 (02/24 2053)   Recent Labs  11/22/15 1318 11/22/15 1759 11/22/15 2115 11/23/15 0033 11/23/15 0400  HGB 13.4  --   --   --   --   HCT 41.1  --   --   --   --   PLT 126*  --   --   --   --   APTT  --  29  --   --   --   LABPROT  --  21.5*  --   --   --   INR  --  1.88  --   --   --   HEPARINUNFRC  --   --   --   --  0.23*  CREATININE 0.96  --   --   --   --   TROPONINI 8.79*  --  0.14* 0.13*  --     Estimated Creatinine Clearance: 27.1 mL/min (by C-G formula based on Cr of 0.96).   Medical History: Past Medical History  Diagnosis Date  . Hypertension   . Other specified forms of tremor   . Osteoporosis   . Diabetes mellitus without complication (HCC)   . Hyperlipidemia   . Constipation   . Hematuria   . Proteinuria   . Age-related cognitive decline   . CAD (coronary artery disease)   . Chronic kidney disease   . Dementia     Assessment: 82 yo patient in ED with weakness, worsening confusion, and dehydration. Troponins elevated at 8.79. Pharmacy consulted for heparin dosing and monitoring for ACS.  Patients weight was called in by ED nurse at 105 lbs (47kg).    Goal of Therapy:  Heparin level 0.3-0.7 units/ml Monitor platelets by anticoagulation protocol: Yes   Plan:  Give 2800 units bolus x 1 Start heparin infusion at 560 units/hr Check anti-Xa level in 8 hours and daily while on heparin Continue to monitor H&H and platelets   Patient high risk of bleeding based on platelets.Continue to monitor platelet trends.   2/25 AM heparin level 0.23. 700 unit bolus and increase rate to 650 units/hr. Recheck in 8  hours.  Cher Nakai, PharmD Pharmacy Resident  11/23/2015,4:49 AM

## 2015-11-23 NOTE — Clinical Social Work Note (Signed)
Clinical Social Work Assessment  Patient Details  Name: Doris Evans MRN: [id number] Date of Birth: [birth year]  Date of referral:  11/23/15               Reason for consult:  End of Life/Hospice                Permission sought to share information with:  Chartered certified accountant granted to share information::  Yes, Hospital doctor (given by husband)  Name::        Agency::     Relationship::     Contact Information:     Housing/Transportation Living arrangements for the past 2 months:  Single Family Home Source of Information:  Spouse, Medical Team Patient Interpreter Needed:  None Criminal Activity/Legal Involvement Pertinent to Current Situation/Hospitalization:    Significant Relationships:  Adult Children, Other Family Members, Spouse Lives with:  Spouse, Adult Children Do you feel safe going back to the place where you live?  Yes (per husband) Need for family participation in patient care:     Care giving concerns:  Husband wants a hospital bed for patient when he takes her home   Social Worker assessment / plan:  CSW consult for hospice screen.  CSW met with patient's husband of 61 years at patient's bedside.  Patient is disoriented.  CSW introduced self and explained role of CSW department and reason for consult.  Husband states he knows this has been coming for a while.  States he and his daughter have been caring for patient at home.   CSW reviewed hospice process and provided patient's husband with a list of hospice facilities and home services. Husband states he is familiar with Hospice Services.  Has selected [facility].  Per Husband would like to take patient home with home hospice services and is open to talking with them as soon as they can meet with him.  Husband states they do not have a hospital bed for patient and he thinks they will need one.   CSW will contact [place]   Employment status:   Retired Nurse, adult PT Recommendations:  Not assessed at this time Information / Referral to community resources:  Other (Comment Required) (hospice screen)  Patient/Family's Response to care:  Patient's husband was appreciative of information provided by CSW and will wait to talk with [facility].   Patient/Family's Understanding of and Emotional Response to Diagnosis, Current Treatment, and Prognosis:  Patient husband is accepting of wife's condition and understands a referral will be made to [facility].  Emotional Assessment Appearance:  Appears stated age Attitude/Demeanor/Rapport:  Unable to Assess Affect (typically observed):  Flat, Agitated Orientation:   (none) Alcohol / Substance use:    Psych involvement (Current and /or in the community):  No (Comment)  Discharge Needs  Concerns to be addressed:    Readmission within the last 30 days:  Yes Current discharge risk:  Chronically ill, Cognitively Impaired, Dependent with Mobility Barriers to Discharge:  Continued Medical Work up   [employer], LCSW 11/23/2015, 5:35 PM

## 2015-11-23 NOTE — Progress Notes (Addendum)
Patient is deeply asleep and could not be waken to take her oral medications. Dr. Elpidio Anis informed if her medications could be changed to IV. New order to hold the medications for tonight. No acute distress noted. Vital signs are stable. Will continue to monitor.

## 2015-11-23 NOTE — Progress Notes (Signed)
Patient is admitted to room 259 with a diagnosis of NSTEMI. Patient is alert but disoriented x 4. No acute respiratory distress noted. Patient is deeply asleep and could not be aroused at this time. Per her daughter, patient has not been sleeping for days and as such she needs to be left to sleep. Tele box verified by two employees. Bed alarm activated and bed is in the lowest position.

## 2015-11-23 NOTE — Progress Notes (Signed)
Pt has no response to IV push cardizem. MD present. Orders for amiodarone gtt. Pts HR is substaining in 130-160s. I will continue to assess.

## 2015-11-23 NOTE — Progress Notes (Signed)
Pt in and out catherized. Pt tolerated procedure. retrieved. I will continue to assess.

## 2015-11-23 NOTE — Progress Notes (Signed)
Poor iv access, attempt midline placement today In the meantime will attempt oral amiodarone assuming patient can tolerate po meds

## 2015-11-23 NOTE — Progress Notes (Signed)
[facility] - [place] at [facility]   PATIENT NAME: Doris Evans    MR#:  [id number]  DATE OF BIRTH:  September 26, 1929  SUBJECTIVE:  Unable to obtain due to dementia Called this morning like complex tachycardia heart rate 170s-200  REVIEW OF SYSTEMS:  Unable to obtain due to dementia  DRUG ALLERGIES:  No Known Allergies  VITALS:  Blood pressure 92/67, pulse 113, temperature 98.1 F (36.7 C), temperature source Oral, resp. rate 16, weight 45.587 kg (100 lb 8 oz), SpO2 99 %.  PHYSICAL EXAMINATION:   VITAL SIGNS: Filed Vitals:   11/23/15 1055 11/23/15 1143  BP: 118/90 92/67  Pulse: 113   Temp:    Resp:     GENERAL:82 y.o.female moderate distress given mental status.  HEAD: Normocephalic, atraumatic.  EYES: Pupils equal, round, reactive to light. Unable to assess extraocular muscles given mental status/medical condition. No scleral icterus.  MOUTH: Moist mucosal membrane. Dentition intact. No abscess noted.  EAR, NOSE, THROAT: Clear without exudates. No external lesions.  NECK: Supple. No thyromegaly. No nodules. No JVD.  PULMONARY: Clear to ascultation, without wheeze rails or rhonci. No use of accessory muscles, Good respiratory effort. good air entry bilaterally CHEST: Nontender to palpation.  CARDIOVASCULAR: S1 and S2. Tachycardic. No murmurs, rubs, or gallops. No edema. Pedal pulses 2+ bilaterally.  GASTROINTESTINAL: Soft, nontender, nondistended. No masses. Positive bowel sounds. No hepatosplenomegaly.  MUSCULOSKELETAL: No swelling, clubbing, or edema. Range of motion full in all extremities.  NEUROLOGIC: Unable to assess given mental status/medical condition SKIN: No ulceration, lesions, rashes, or cyanosis. Skin warm and dry. Turgor intact.  PSYCHIATRIC: Unable to assess given mental status/medical condition-patient moaning agitated        LABORATORY PANEL:   CBC  Recent Labs Lab 11/23/15 0537  WBC 20.7*  HGB 14.1  HCT 43.6  PLT 107*    ------------------------------------------------------------------------------------------------------------------  Chemistries   Recent Labs Lab 11/23/15 0537  NA 134*  K 4.0  CL 102  CO2 22  GLUCOSE 212*  BUN 27*  CREATININE 1.01*  CALCIUM 9.4   ------------------------------------------------------------------------------------------------------------------  Cardiac Enzymes  Recent Labs Lab 11/23/15 0537  TROPONINI 0.15*   ------------------------------------------------------------------------------------------------------------------  RADIOLOGY:  Ct Head Wo Contrast  11/22/2015  CLINICAL DATA:  And dementia with hallucinations. EXAM: CT HEAD WITHOUT CONTRAST TECHNIQUE: Contiguous axial images were obtained from the base of the skull through the vertex without intravenous contrast. COMPARISON:  10/22/2015 FINDINGS: There is no evidence for acute hemorrhage, hydrocephalus, mass lesion, or abnormal extra-axial fluid collection. No definite CT evidence for acute infarction. Diffuse loss of parenchymal volume is consistent with atrophy. Patchy low attenuation in the deep hemispheric and periventricular white matter is nonspecific, but likely reflects chronic microvascular ischemic demyelination. The visualized paranasal sinuses and mastoid air cells are clear. IMPRESSION: Stable.  No acute intracranial abnormality. Atrophy with chronic small vessel white matter ischemic disease. Electronically Signed   By: Kennith Center M.D.   On: 11/22/2015 16:10   Dg Chest Portable 1 View  11/22/2015  CLINICAL DATA:  Weakness and fatigue, history of dementia, decreased oral intake history of diabetes and coronary artery disease. EXAM: PORTABLE CHEST 1 VIEW COMPARISON:  PA and lateral chest x-ray of October 25, 2015. FINDINGS: The lungs are hypoinflated. Increased density at both lung bases is consistent with atelectasis or early pneumonia. There is no definite pleural effusion. The cardiac  silhouette remains enlarged but is stable. There is mild stable central pulmonary vascular prominence. The interstitial markings are coarse but stable.  The bones are osteopenic. IMPRESSION: The study is limited due to hypoinflation. Bibasilar atelectasis or early pneumonia is suspected. There is chronic low-grade compensated CHF. Electronically Signed   By: David  [place] M.D.   On: 11/22/2015 16:17    EKG:   Orders placed or performed during the hospital encounter of 11/22/15  . ED EKG  . ED EKG    ASSESSMENT AND PLAN:   82 year old female with past medical history of advanced dementia, cognitive decline, hypertension, diabetes, who presents to the hospital due to poor by mouth intake, weakness, hallucinations at night and difficulty sleeping and noted to have an elevated troponin.  1. Wide complex tachycardia received Cardizem this morning without any improvement started on amiodarone bolus followed by drip. Cardiology following  2. Elevated troponin: Demand  3 advanced dementia-patient has been having significant hallucinations, failure to thrive, poor by mouth intake which has progressively gotten worse over the past few weeks. -I will start her on some Seroquel at bedtime. Use Haldol as needed for agitation. Avoid benzodiazepines. -Place her on some IV fluids. Get a case management/social work consult to discuss possible hospice screening and discussed this with the patient's family. - cont. Aricept.   4 HTN - cont. Losartan.   5 Hyperglycemia - due to uncontrolled DM.  - place on SSI and follow BS.      All the records are reviewed and case discussed with Care Management/Social Workerr. Management plans discussed with the patient, family and they are in agreement.  CODE STATUS: dnr  TOTAL TIME TAKING CARE OF THIS PATIENT: 50 critical minutes.   POSSIBLE D/C IN 2-3 DAYS, DEPENDING ON CLINICAL CONDITION.   Hower,  Mardi Mainland.D on 11/23/2015 at 1:42 PM  Between 7am to 6pm  - Pager - [phone]  After 6pm: House Pager: - [phone]  [name]  Office  [phone]  CC: Primary care physician; [name], NP

## 2015-11-24 LAB — GLUCOSE, CAPILLARY: Glucose-Capillary: 152 mg/dL — ABNORMAL HIGH (ref 65–99)

## 2015-11-24 MED ORDER — ATORVASTATIN CALCIUM 40 MG PO TABS: 40.0000 mg | ORAL_TABLET | Freq: Every day | ORAL | Status: AC

## 2015-11-24 MED ORDER — QUETIAPINE FUMARATE 50 MG PO TABS: 50.0000 mg | ORAL_TABLET | Freq: Every day | ORAL | Status: AC

## 2015-11-24 MED ORDER — SODIUM CHLORIDE 0.9 % IV BOLUS (SEPSIS)
500.0000 mL | Freq: Once | INTRAVENOUS | Status: AC
  Administered 2015-11-24: 500 mL via INTRAVENOUS

## 2015-11-24 MED ORDER — NYSTATIN 100000 UNIT/ML MT SUSP: 5.0000 mL | Freq: Four times a day (QID) | OROMUCOSAL | Status: AC

## 2015-11-24 MED ORDER — MORPHINE SULFATE 20 MG/5ML PO SOLN: 2.5000 mg | ORAL | Status: AC | PRN

## 2015-11-24 MED ORDER — AMIODARONE HCL 400 MG PO TABS: 400.0000 mg | ORAL_TABLET | Freq: Two times a day (BID) | ORAL | Status: AC

## 2015-11-24 MED ORDER — HEPARIN SODIUM (PORCINE) 5000 UNIT/ML IJ SOLN: 5000.0000 [IU] | Freq: Three times a day (TID) | INTRAMUSCULAR | Status: DC

## 2015-11-24 NOTE — Care Management Note (Signed)
Case Management Note  Patient Details  Name: Doris Evans MRN: [id number] Date of Birth: 1929-08-17  Subjective/Objective:  Call to Verlon Au at [facility] requesting that she please call [name]'s husband who is asking to speak with someone from Hospice. Mr Mayer is requesting hospice in her home services for his wife and a hospital bed to be delivered to the home.  Burna Mortimer agreed to contact Mr Koogler today to discuss hospice services.                   Action/Plan:   Expected Discharge Date:                  Expected Discharge Plan:     In-House Referral:     Discharge planning Services     Post Acute Care Choice:    Choice offered to:     DME Arranged:    DME Agency:     HH Arranged:    HH Agency:     Status of Service:     Medicare Important Message Given:    Date Medicare IM Given:    Medicare IM give by:    Date Additional Medicare IM Given:    Additional Medicare Important Message give by:     If discussed at Long Length of Stay Meetings, dates discussed:    Additional Comments:  Jaimin Krupka A, RN 11/24/2015, 1:37 PM

## 2015-11-24 NOTE — Progress Notes (Signed)
[facility] Cardiology [facility] Encounter Note  Patient: Doris Evans / Admit Date: 11/22/2015 / Date of Encounter: 11/24/2015, 6:35 AM   Subjective: Patient is not conversing well. Patient does appear to be hemodynamically stable without evidence of distress  Review of Systems:  Unable to assess due to dementia  Objective: Telemetry: Normal sinus rhythm Physical Exam: Blood pressure 133/84, pulse 81, temperature 97.4 F (36.3 C), temperature source Oral, resp. rate 22, weight 100 lb 8 oz (45.587 kg), SpO2 97 %. Body mass index is 21.53 kg/(m^2). General: Well developed, poorly nourished, in no acute distress. Head: Normocephalic, atraumatic, sclera non-icteric, no xanthomas, nares are without discharge. Neck: No apparent masses Lungs: Normal respirations with no wheezes, some rhonchi, no rales , few crackles   Heart: Regular rate and rhythm, normal S1 S2, 2+ apical murmur, no rub, no gallop, PMI is normal size and placement, carotid upstroke normal without bruit, jugular venous pressure normal Abdomen: Soft, non-tender, non-distended with normoactive bowel sounds. No hepatosplenomegaly. Abdominal aorta is normal size without bruit Extremities: Trace edema, no clubbing, no cyanosis, no ulcers,  Peripheral: 2+ radial, 2+ femoral, 2+ dorsal pedal pulses Neuro: Not Alert and oriented. Moves all extremities spontaneously.     Intake/Output Summary (Last 24 hours) at 11/24/15 0635 Last data filed at 11/24/15 1610  Gross per 24 hour  Intake 3194.42 ml  Output    250 ml  Net 2944.42 ml    Inpatient Medications:  . amiodarone  400 mg Oral BID  . aspirin EC  81 mg Oral Daily  . atorvastatin  40 mg Oral q1800  . donepezil  5 mg Oral QHS  . insulin aspart  0-5 Units Subcutaneous QHS  . insulin aspart  0-9 Units Subcutaneous TID WC  . losartan  50 mg Oral Daily  . nystatin  5 mL Oral QID  . potassium chloride  10 mEq Oral Daily  . QUEtiapine  50 mg Oral QHS  . sodium chloride flush   3 mL Intravenous Q12H   Infusions:  . sodium chloride 75 mL/hr at 11/24/15 0619  . heparin 650 Units/hr (11/24/15 0507)    Labs:  Recent Labs  11/22/15 1318 11/23/15 0537  NA 134* 134*  K 4.2 4.0  CL 99* 102  CO2 24 22  GLUCOSE 415* 212*  BUN 30* 27*  CREATININE 0.96 1.01*  CALCIUM 9.9 9.4   No results for input(s): AST, ALT, ALKPHOS, BILITOT, PROT, ALBUMIN in the last 72 hours.  Recent Labs  11/22/15 1318 11/23/15 0537  WBC 14.5* 20.7*  HGB 13.4 14.1  HCT 41.1 43.6  MCV 101.9* 102.1*  PLT 126* 107*    Recent Labs  11/22/15 1318 11/22/15 2115 11/23/15 0033 11/23/15 0537  TROPONINI 8.79* 0.14* 0.13* 0.15*   Invalid input(s): POCBNP No results for input(s): HGBA1C in the last 72 hours.   Weights: Filed Weights   11/22/15 2053  Weight: 100 lb 8 oz (45.587 kg)     Radiology/Studies:  Dg Chest 2 View  10/25/2015  CLINICAL DATA:  Wheezing. EXAM: CHEST  2 VIEW COMPARISON:  September 12, 2014. FINDINGS: Stable cardiomediastinal silhouette. Mildly increased interstitial densities are noted throughout both lungs concerning for pulmonary edema. Mild bilateral pleural effusions are noted. Atherosclerosis of descending thoracic aorta is noted. Bony thorax is unremarkable. IMPRESSION: Probable mild diffuse pulmonary edema with mild bilateral pleural effusions. Electronically Signed   By: Lupita Raider, M.D.   On: 10/25/2015 16:25   Ct Head Wo Contrast  11/22/2015  CLINICAL DATA:  And dementia with hallucinations. EXAM: CT HEAD WITHOUT CONTRAST TECHNIQUE: Contiguous axial images were obtained from the base of the skull through the vertex without intravenous contrast. COMPARISON:  10/22/2015 FINDINGS: There is no evidence for acute hemorrhage, hydrocephalus, mass lesion, or abnormal extra-axial fluid collection. No definite CT evidence for acute infarction. Diffuse loss of parenchymal volume is consistent with atrophy. Patchy low attenuation in the deep hemispheric and  periventricular white matter is nonspecific, but likely reflects chronic microvascular ischemic demyelination. The visualized paranasal sinuses and mastoid air cells are clear. IMPRESSION: Stable.  No acute intracranial abnormality. Atrophy with chronic small vessel white matter ischemic disease. Electronically Signed   By: Kennith Center M.D.   On: 11/22/2015 16:10   Dg Chest Portable 1 View  11/22/2015  CLINICAL DATA:  Weakness and fatigue, history of dementia, decreased oral intake history of diabetes and coronary artery disease. EXAM: PORTABLE CHEST 1 VIEW COMPARISON:  PA and lateral chest x-ray of October 25, 2015. FINDINGS: The lungs are hypoinflated. Increased density at both lung bases is consistent with atelectasis or early pneumonia. There is no definite pleural effusion. The cardiac silhouette remains enlarged but is stable. There is mild stable central pulmonary vascular prominence. The interstitial markings are coarse but stable. The bones are osteopenic. IMPRESSION: The study is limited due to hypoinflation. Bibasilar atelectasis or early pneumonia is suspected. There is chronic low-grade compensated CHF. Electronically Signed   By: David  Swaziland M.D.   On: 11/22/2015 16:17     Assessment and Recommendation  83 y.o. female with known diabetes coronary artery disease dementia chronic kidney disease stage III essential hypertension with acute non-ST elevation myocardial infarction Echocardiogram shows severe LV systolic dysfunction with ejection fraction of 15% with moderate to severe mitral and tricuspid regurgitation and severe pulmonary hypertension 1. Heparin for 48 hours and therefore may discontinue today 2. Aspirin for further risk reduction cardiovascular event 3. Possible introduction of low-dose beta blocker including carvedilol for cardiomyopathy as well as myocardial infarction 4. High intensity cholesterol therapy with atorvastatin 5. Begin ambulation and rehabilitation 6. No  further cardiac diagnostics necessary at this time  Signed, Arnoldo Hooker M.D. FACC

## 2015-11-24 NOTE — Progress Notes (Addendum)
Dr. Sheryle Hail notified of bladder scan results ( ) and x1 incontinent episode. bolus to stimulate pt. To urinate. Bladder is not distended, no discomforted noted while palpating bladder. Will continue to monitor pt.

## 2015-11-24 NOTE — Progress Notes (Signed)
Pt shows fine crackles to ascultation, IVF at 51ml/hr. MD notified. Orders received. I will continue to assess.

## 2015-11-24 NOTE — Progress Notes (Signed)
Hospice Referral completed, Patient's husband has requested to take patient home with [facility] services.  CSW spoke with Burna Mortimer weekend hospice RN.  Referral has been received.   If patient discharges over weekend CSW will fax [phone]) all discharge information to the hospice referral line.  If patient remains in the hospital, the [facility] liaison will meet with family on Monday.       CSW will continue to follow for ongoing  needs.   Sammuel Hines. LCSWA Clinical Social Work Department [phone] 9:26 AM

## 2015-11-24 NOTE — Care Management Note (Signed)
Case Management Note  Patient Details  Name: Doris Evans MRN: [id number] Date of Birth: 21-Jan-1929  Subjective/Objective:   Call to Verlon Au regarding discharge to patient's home with Hospice services. All available orders and notes faxed to [facility]. Updated Burna Mortimer that Doris Evans's husband is requesting a hospital bed.                  Action/Plan:   Expected Discharge Date:                  Expected Discharge Plan:     In-House Referral:     Discharge planning Services     Post Acute Care Choice:    Choice offered to:     DME Arranged:    DME Agency:     HH Arranged:    HH Agency:     Status of Service:     Medicare Important Message Given:    Date Medicare IM Given:    Medicare IM give by:    Date Additional Medicare IM Given:    Additional Medicare Important Message give by:     If discussed at Long Length of Stay Meetings, dates discussed:    Additional Comments:  Imya Mance A, RN 11/24/2015, 11:20 AM

## 2015-11-24 NOTE — Discharge Summary (Signed)
[facility] [place] at [facility]   PATIENT NAME: Doris Evans    MR#:  [id number]  DATE OF BIRTH:  August 15, 1929  DATE OF ADMISSION:  11/22/2015 ADMITTING PHYSICIAN: Houston Siren, MD  DATE OF DISCHARGE: No discharge date for patient encounter.  PRIMARY CARE PHYSICIAN: [name], NP    ADMISSION DIAGNOSIS:  Encephalopathy [G93.40] NSTEMI (non-ST elevated myocardial infarction) (HCC) [I21.4]  DISCHARGE DIAGNOSIS:  Wide complex tachycardia Elevated troponin-demand ischemia Metabolic encephalopathy setting of baseline dementia  SECONDARY DIAGNOSIS:   Past Medical History  Diagnosis Date  . Hypertension   . Other specified forms of tremor   . Osteoporosis   . Diabetes mellitus without complication (HCC)   . Hyperlipidemia   . Constipation   . Hematuria   . Proteinuria   . Age-related cognitive decline   . CAD (coronary artery disease)   . Chronic kidney disease   . Dementia     HOSPITAL COURSE:  Kyiesha Evans  is a 82 y.o. female admitted 11/22/2015 with chief complaint altered mental status. Please see H&P performed by Dr. Cherlynn Kaiser for further information. She was admitted to the hospital given poor oral intake, confusion and lethargy. Found to have elevated troponin started on a heparin drip for concern NSTEMI. Fortunately cardiac enzymes normalized heparin discontinued. Cardiology was consult within aided in these decisions. During her hospitalization she also had an episode of wide complex tachycardia placed on amiodarone drip successfully transitioned to oral medications heart rate has been controlled since. Family discussed with social work/case management/hospice discharge plans and are planning to go home with hospice care  DISCHARGE CONDITIONS:   Stable  CONSULTS OBTAINED:  Treatment Team:  Lamar Blinks, MD  DRUG ALLERGIES:  No Known Allergies  DISCHARGE MEDICATIONS:   Current Discharge Medication List    START taking these  medications   Details  amiodarone (PACERONE) 400 MG tablet Take 1 tablet (400 mg total) by mouth 2 (two) times daily. Qty: 60 tablet, Refills: 0    atorvastatin (LIPITOR) 40 MG tablet Take 1 tablet (40 mg total) by mouth daily at 6 PM. Qty: 30 tablet, Refills: 0    morphine 20 MG/5ML solution Take 0.6 mLs (2.4 mg total) by mouth every 2 (two) hours as needed for pain. Qty: 50 mL, Refills: 0    nystatin (MYCOSTATIN) 100000 UNIT/ML suspension Take 5 mLs (500,000 Units total) by mouth 4 (four) times daily. Qty: 60 mL, Refills: 0    QUEtiapine (SEROQUEL) 50 MG tablet Take 1 tablet (50 mg total) by mouth at bedtime. Qty: 30 tablet, Refills: 0      CONTINUE these medications which have NOT CHANGED   Details  aspirin EC 81 MG tablet Take 81 mg by mouth daily.    donepezil (ARICEPT) 5 MG tablet Take 1 tablet (5 mg total) by mouth at bedtime. Qty: 30 tablet, Refills: 3    furosemide (LASIX) 20 MG tablet Take 1 tablet (20 mg total) by mouth daily. Qty: 30 tablet, Refills: 0    losartan (COZAAR) 50 MG tablet Take 1 tablet (50 mg total) by mouth daily. Qty: 90 tablet, Refills: 3    potassium chloride (K-DUR) 10 MEQ tablet Take 1 tablet (10 mEq total) by mouth daily. Qty: 30 tablet, Refills: 0      STOP taking these medications     clindamycin (CLEOCIN) 2 % vaginal cream      insulin detemir (LEVEMIR) 100 UNIT/ML injection  DISCHARGE INSTRUCTIONS:    DIET:  Regular diet  DISCHARGE CONDITION:  Stable  ACTIVITY:  Activity as tolerated  OXYGEN:  Home Oxygen: No.   Oxygen Delivery: room air  DISCHARGE LOCATION:  home with [facility]   If you experience worsening of your admission symptoms, develop shortness of breath, life threatening emergency, suicidal or homicidal thoughts you must seek medical attention immediately by calling 911 or calling your MD immediately  if symptoms less severe.  You Must read complete instructions/literature along  with all the possible adverse reactions/side effects for all the Medicines you take and that have been prescribed to you. Take any new Medicines after you have completely understood and accpet all the possible adverse reactions/side effects.   Please note  You were cared for by a hospitalist during your hospital stay. If you have any questions about your discharge medications or the care you received while you were in the hospital after you are discharged, you can call the unit and asked to speak with the hospitalist on call if the hospitalist that took care of you is not available. Once you are discharged, your primary care physician will handle any further medical issues. Please note that NO REFILLS for any discharge medications will be authorized once you are discharged, as it is imperative that you return to your primary care physician (or establish a relationship with a primary care physician if you do not have one) for your aftercare needs so that they can reassess your need for medications and monitor your lab values.    On the day of Discharge:   VITAL SIGNS:  Blood pressure 133/84, pulse 81, temperature 97.4 F (36.3 C), temperature source Oral, resp. rate 22, weight 45.587 kg (100 lb 8 oz), SpO2 97 %.  I/O:   Intake/Output Summary (Last 24 hours) at 11/24/15 1045 Last data filed at 11/24/15 0830  Gross per 24 hour  Intake 2457.81 ml  Output    250 ml  Net 2207.81 ml    PHYSICAL EXAMINATION:  GENERAL:  82 y.o.-year-old patient lying in the bed with no acute distress. Confused but more alert  EYES: Pupils equal, round, reactive to light and accommodation. No scleral icterus. Extraocular muscles intact.  HEENT: Head atraumatic, normocephalic. Oropharynx and nasopharynx clear.  NECK:  Supple, no jugular venous distention. No thyroid enlargement, no tenderness.  LUNGS: Diminished breath sounds bilaterally, no wheezing, rales,rhonchi or crepitation. No use of accessory muscles of  respiration.  CARDIOVASCULAR: S1, S2 irregular. No murmurs, rubs, or gallops.  ABDOMEN: Soft, non-tender, non-distended. Bowel sounds present. No organomegaly or mass.  EXTREMITIES: No pedal edema, cyanosis, or clubbing.  NEUROLOGIC: Cranial nerves II through XII are intact. Muscle strength 5/5 in all extremities. Sensation intact. Gait not checked.  PSYCHIATRIC: The patient is awake and alert to self  SKIN: No obvious rash, lesion, or ulcer.   DATA REVIEW:   CBC  Recent Labs Lab 11/23/15 0537  WBC 20.7*  HGB 14.1  HCT 43.6  PLT 107*    Chemistries   Recent Labs Lab 11/23/15 0537  NA 134*  K 4.0  CL 102  CO2 22  GLUCOSE 212*  BUN 27*  CREATININE 1.01*  CALCIUM 9.4    Cardiac Enzymes  Recent Labs Lab 11/23/15 0537  TROPONINI 0.15*    Microbiology Results  Results for orders placed or performed during the hospital encounter of 11/22/15  Culture, blood (Routine X 2) w Reflex to ID Panel     Status: None (  Preliminary result)   Collection Time: 11/22/15  5:13 PM  Result Value Ref Range Status   Specimen Description BLOOD RIGHT ARM  Final   Special Requests BOTTLES DRAWN AEROBIC AND ANAEROBIC  1CC  Final   Culture NO GROWTH 2 DAYS  Final   Report Status PENDING  Incomplete  Culture, blood (Routine X 2) w Reflex to ID Panel     Status: None (Preliminary result)   Collection Time: 11/22/15  5:13 PM  Result Value Ref Range Status   Specimen Description BLOOD LEFT ARM  Final   Special Requests BOTTLES DRAWN AEROBIC AND ANAEROBIC  1CC  Final   Culture NO GROWTH 2 DAYS  Final   Report Status PENDING  Incomplete    RADIOLOGY:  Ct Head Wo Contrast  11/22/2015  CLINICAL DATA:  And dementia with hallucinations. EXAM: CT HEAD WITHOUT CONTRAST TECHNIQUE: Contiguous axial images were obtained from the base of the skull through the vertex without intravenous contrast. COMPARISON:  10/22/2015 FINDINGS: There is no evidence for acute hemorrhage, hydrocephalus, mass lesion,  or abnormal extra-axial fluid collection. No definite CT evidence for acute infarction. Diffuse loss of parenchymal volume is consistent with atrophy. Patchy low attenuation in the deep hemispheric and periventricular white matter is nonspecific, but likely reflects chronic microvascular ischemic demyelination. The visualized paranasal sinuses and mastoid air cells are clear. IMPRESSION: Stable.  No acute intracranial abnormality. Atrophy with chronic small vessel white matter ischemic disease. Electronically Signed   By: Kennith Center M.D.   On: 11/22/2015 16:10   Dg Chest Portable 1 View  11/22/2015  CLINICAL DATA:  Weakness and fatigue, history of dementia, decreased oral intake history of diabetes and coronary artery disease. EXAM: PORTABLE CHEST 1 VIEW COMPARISON:  PA and lateral chest x-ray of October 25, 2015. FINDINGS: The lungs are hypoinflated. Increased density at both lung bases is consistent with atelectasis or early pneumonia. There is no definite pleural effusion. The cardiac silhouette remains enlarged but is stable. There is mild stable central pulmonary vascular prominence. The interstitial markings are coarse but stable. The bones are osteopenic. IMPRESSION: The study is limited due to hypoinflation. Bibasilar atelectasis or early pneumonia is suspected. There is chronic low-grade compensated CHF. Electronically Signed   By: David  Swaziland M.D.   On: 11/22/2015 16:17     Management plans discussed with the patient, family and they are in agreement.  CODE STATUS:     Code Status Orders        Start     Ordered   11/22/15 2059  Do not attempt resuscitation (DNR)   Continuous    Question Answer Comment  In the event of cardiac or respiratory ARREST Do not call a "code blue"   In the event of cardiac or respiratory ARREST Do not perform Intubation, CPR, defibrillation or ACLS   In the event of cardiac or respiratory ARREST Use medication by any route, position, wound care, and  other measures to relive pain and suffering. May use oxygen, suction and manual treatment of airway obstruction as needed for comfort.      11/22/15 2058    Code Status History    Date Active Date Inactive Code Status Order ID Comments User Context   10/22/2015  3:04 PM 10/27/2015  4:50 PM Full Code [id number]  Auburn Bilberry, MD ED      TOTAL TIME TAKING CARE OF THIS PATIENT: 28 minutes.    Hower,  Mardi Mainland.D on 11/24/2015 at 10:45 AM  Between  7am to 6pm - Pager - [phone]  After 6pm go to [email or web address] - password [email or web address] [facility]  [place] [facility]  Office  [phone]  CC: Primary care physician; [name], NP

## 2015-11-25 NOTE — Telephone Encounter (Signed)
Called [facility] to see about doing a referral for the patient. They stated that because the patient has [employer], they could not take her at this time. I gave this information to [place] and she said ok and that the patient was in the hospital so they should get some sort of home health set up.

## 2015-11-26 NOTE — Progress Notes (Signed)
Patient family member, Barkley Boards called to report difficulties with a prescription from physician. Dr. Clint Guy paged and given the Bonnie's phone number; reports he will call and follow up.

## 2015-11-27 LAB — CULTURE, BLOOD (ROUTINE X 2)
CULTURE: NO GROWTH
Culture: NO GROWTH

## 2015-12-11 ENCOUNTER — Telehealth: Payer: Self-pay | Admitting: Unknown Physician Specialty

## 2015-12-11 NOTE — Telephone Encounter (Signed)
Routing to Hershey CompanyCheryl- FYI.

## 2015-12-11 NOTE — Telephone Encounter (Signed)
I called to offer condolences; explained that Doris CirriCheryl Evans was away, and was sure she would call if she were here; he appreciated the call and us thinking about them They were married for 68 years; offered condolences; let us know if anything we can do here ------------------------ Doris Evans She still has appt marked for next week; please mark chart accordingly; thank you

## 2015-12-11 NOTE — Telephone Encounter (Signed)
Pts husband called to notify the office that the pt had passed away March 1st 2017.

## 2015-12-17 ENCOUNTER — Ambulatory Visit: Payer: Self-pay | Admitting: Unknown Physician Specialty

## 2015-12-28 DEATH — deceased

## 2016-09-07 ENCOUNTER — Telehealth: Payer: Self-pay | Admitting: Unknown Physician Specialty

## 2016-09-08 NOTE — Telephone Encounter (Signed)
Note in error.

## 2016-09-18 ENCOUNTER — Telehealth: Payer: Self-pay | Admitting: Unknown Physician Specialty

## 2016-09-18 NOTE — Telephone Encounter (Signed)
[facility] regarding the status of med records he had requested on this patient.  Thank You  Clydie BraunKaren

## 2016-09-18 NOTE — Telephone Encounter (Signed)
Called and left a message, asking for a return call to see what is needed for this patient and to let them know that we have not seen this patient in a while.

## 2016-09-29 NOTE — Telephone Encounter (Signed)
No return call, will close encounter.  

## 2017-08-06 IMAGING — CR DG CHEST 2V
2 series · 2 of 2 positions shown · non-contrast
Comparison: September 12, 2014.

CLINICAL DATA: Wheezing.

EXAM:
CHEST  2 VIEW

[chest lat]
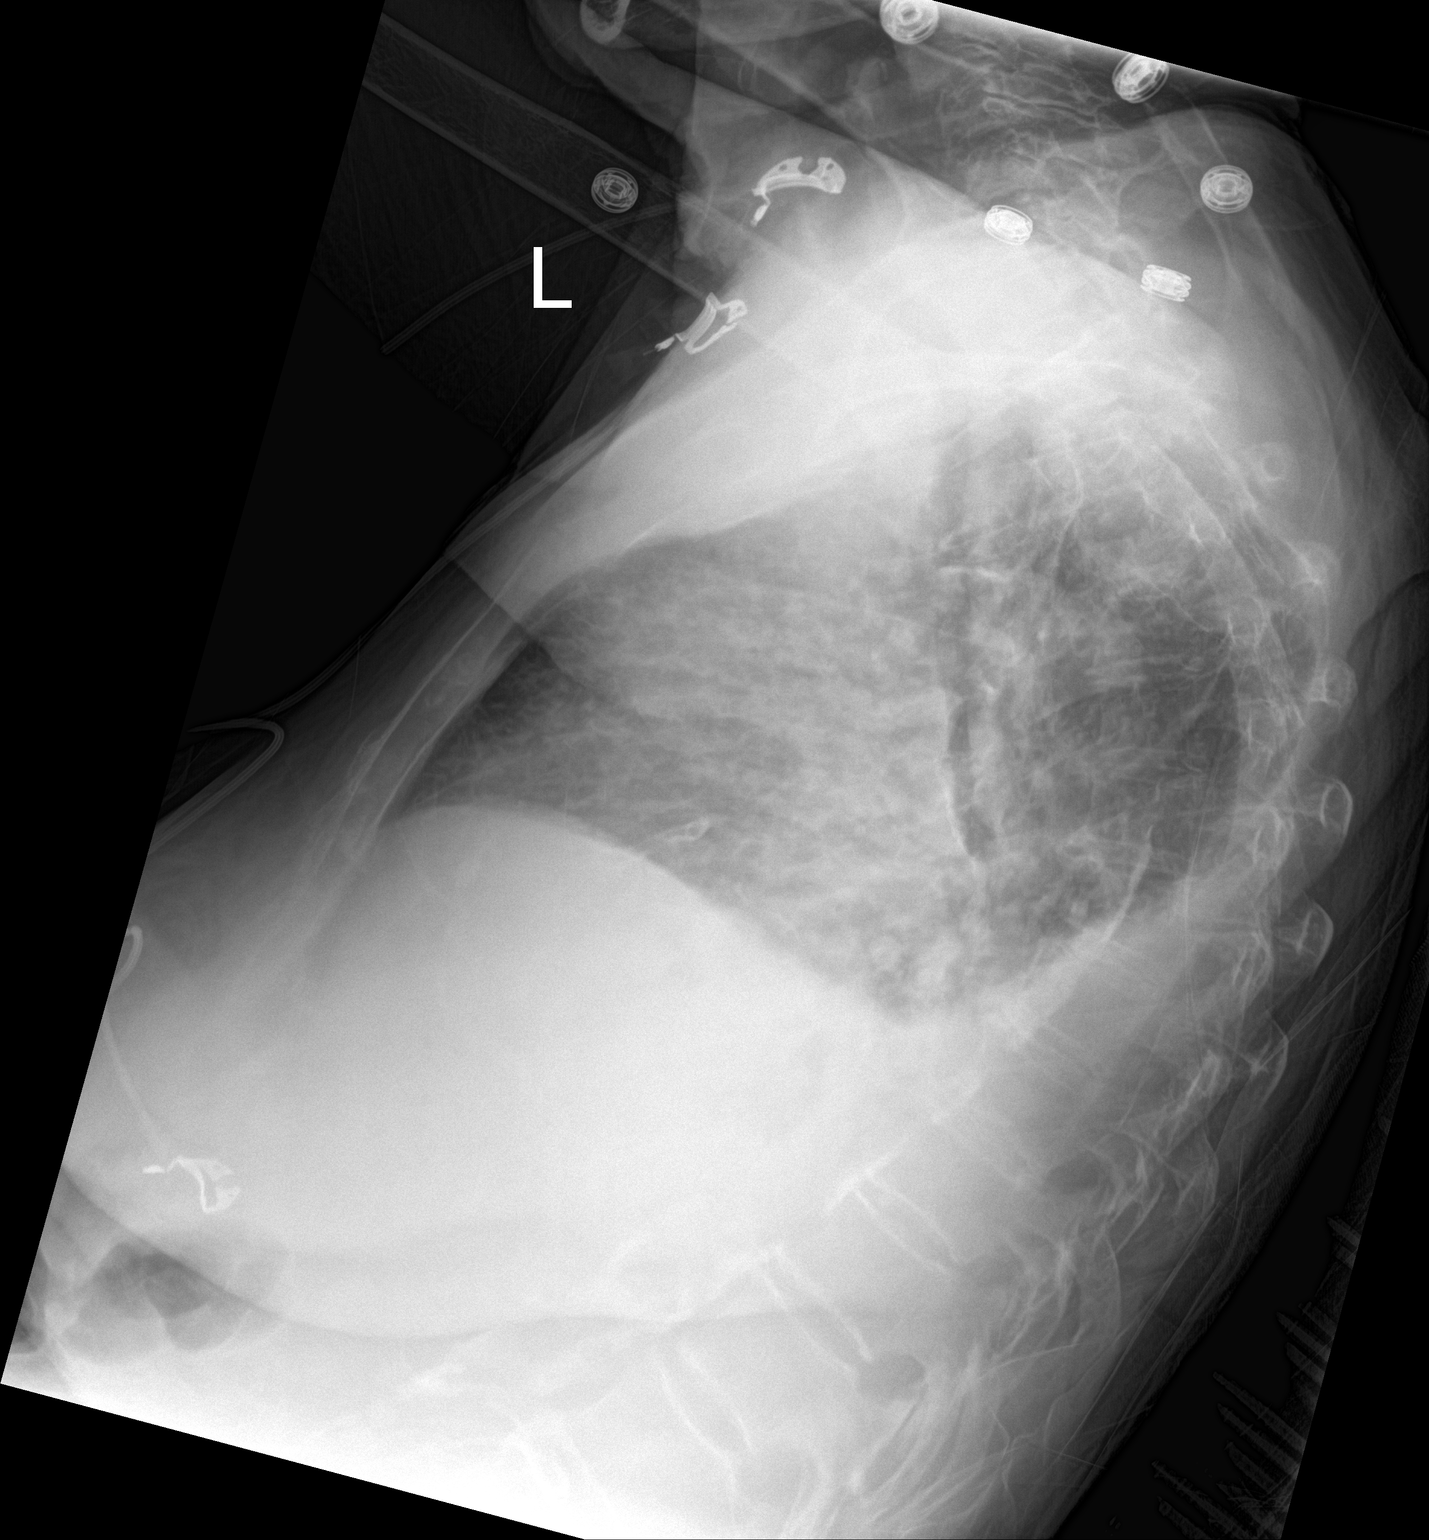

[chest ap]
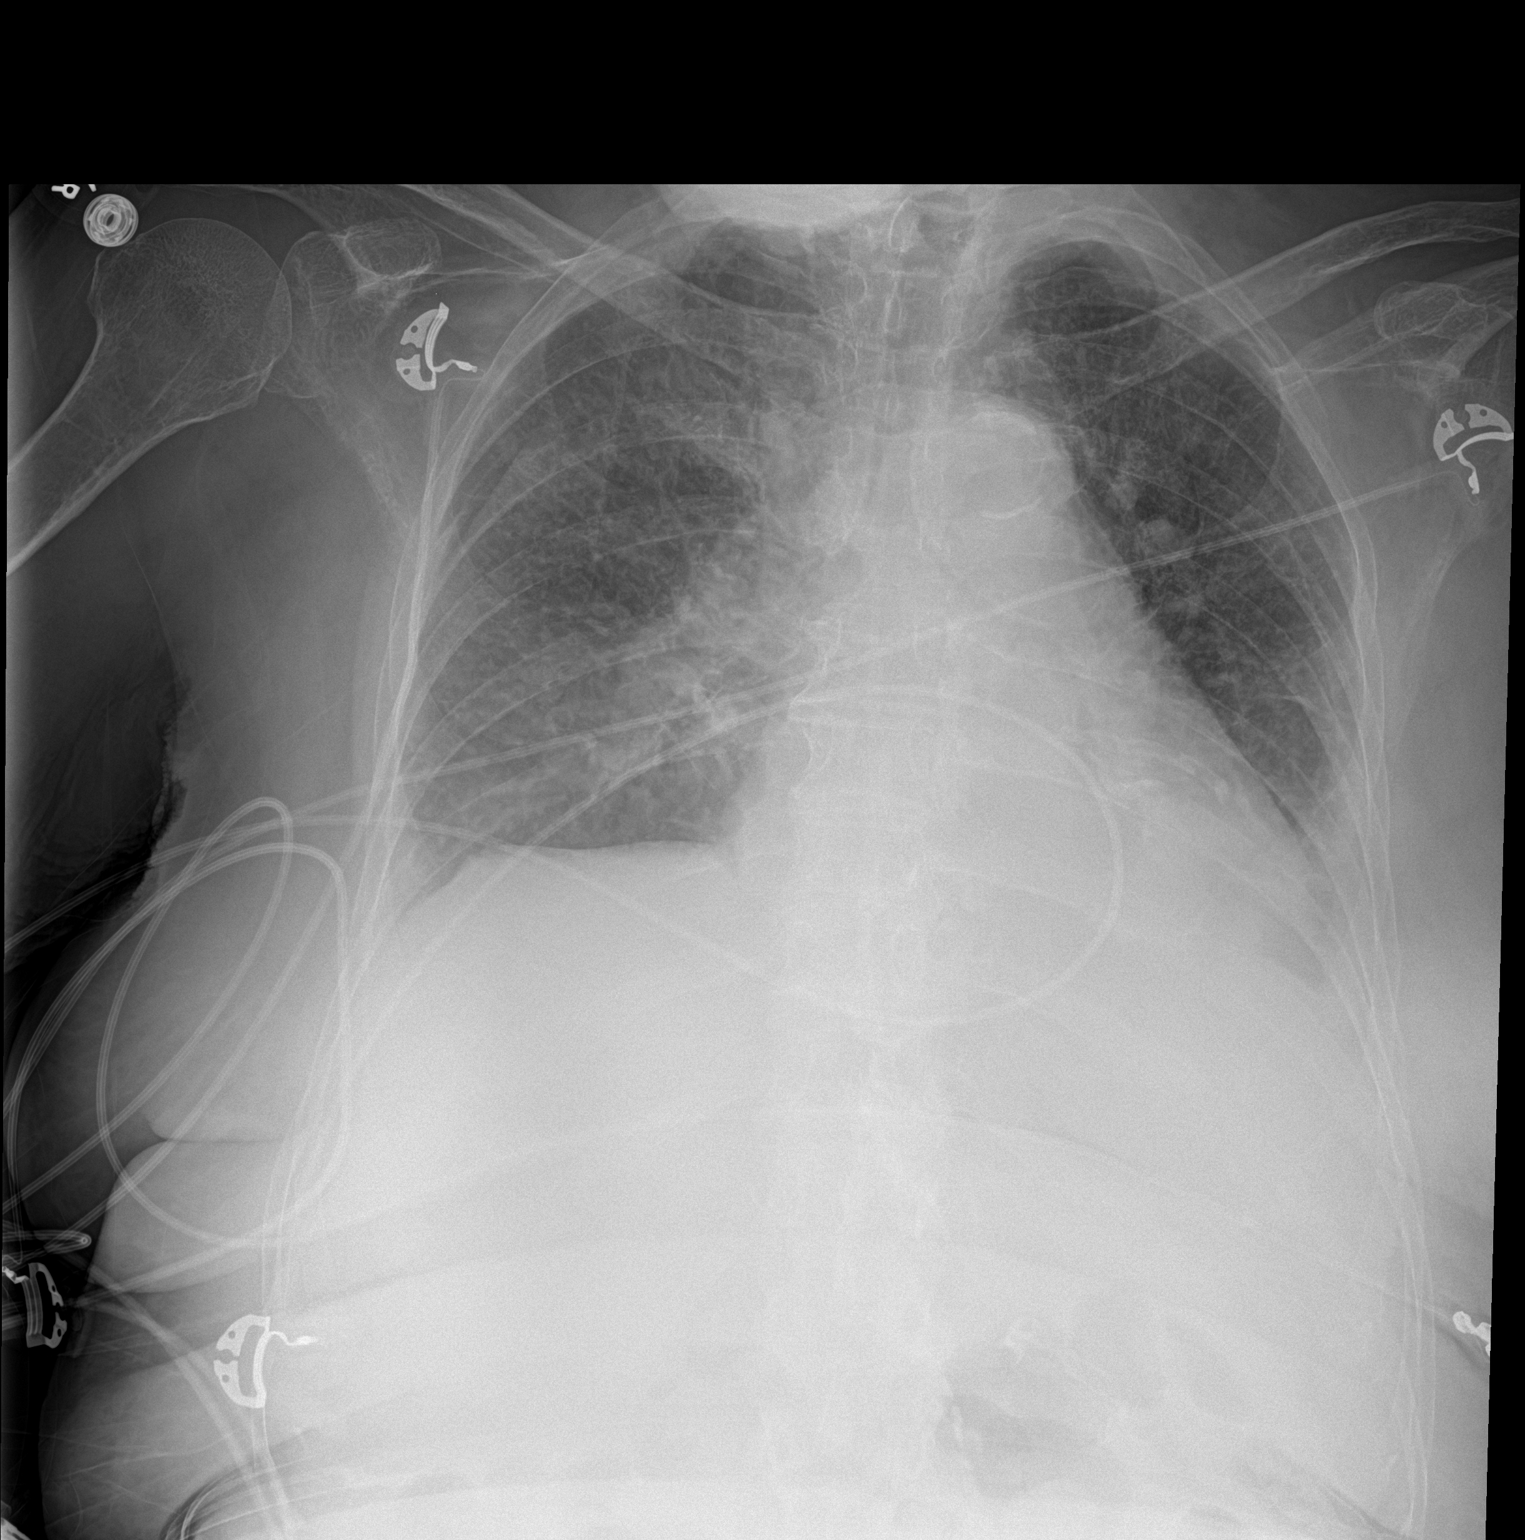

[2 of 2 positions shown; findings below may reference images not displayed]

FINDINGS: Stable cardiomediastinal silhouette. Mildly increased interstitial
densities are noted throughout both lungs concerning for pulmonary
edema. Mild bilateral pleural effusions are noted. Atherosclerosis
of descending thoracic aorta is noted. Bony thorax is unremarkable.
IMPRESSION: Probable mild diffuse pulmonary edema with mild bilateral pleural
effusions.
